# Patient Record
Sex: Male | Born: 1985 | Race: White | Hispanic: No | Marital: Single
Health system: Southern US, Community
[De-identification: ages and names within clinical notes are randomized; demographics above are authoritative.]

---
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
## 2004-09-26 ENCOUNTER — Encounter: Admission: RE | Admit: 2004-09-26 | Discharge: 2004-09-26 | Payer: Self-pay | Admitting: Orthopedic Surgery

## 2004-09-27 ENCOUNTER — Encounter: Admission: RE | Admit: 2004-09-27 | Discharge: 2004-09-27 | Payer: Self-pay | Admitting: Orthopedic Surgery

## 2017-02-09 ENCOUNTER — Emergency Department (HOSPITAL_COMMUNITY): Payer: Medicaid Other

## 2017-02-09 ENCOUNTER — Inpatient Hospital Stay (HOSPITAL_COMMUNITY)
Admission: EM | Admit: 2017-02-09 | Discharge: 2017-03-15 | DRG: 917 | Disposition: E | Payer: Medicaid Other | Attending: Emergency Medicine | Admitting: Emergency Medicine

## 2017-02-09 DIAGNOSIS — E871 Hypo-osmolality and hyponatremia: Secondary | ICD-10-CM | POA: Diagnosis present

## 2017-02-09 DIAGNOSIS — Q899 Congenital malformation, unspecified: Secondary | ICD-10-CM

## 2017-02-09 DIAGNOSIS — T401X1A Poisoning by heroin, accidental (unintentional), initial encounter: Principal | ICD-10-CM | POA: Diagnosis present

## 2017-02-09 DIAGNOSIS — R402312 Coma scale, best motor response, none, at arrival to emergency department: Secondary | ICD-10-CM | POA: Diagnosis present

## 2017-02-09 DIAGNOSIS — Z515 Encounter for palliative care: Secondary | ICD-10-CM | POA: Diagnosis present

## 2017-02-09 DIAGNOSIS — Z9911 Dependence on respirator [ventilator] status: Secondary | ICD-10-CM

## 2017-02-09 DIAGNOSIS — J9600 Acute respiratory failure, unspecified whether with hypoxia or hypercapnia: Secondary | ICD-10-CM

## 2017-02-09 DIAGNOSIS — R402212 Coma scale, best verbal response, none, at arrival to emergency department: Secondary | ICD-10-CM | POA: Diagnosis present

## 2017-02-09 DIAGNOSIS — J690 Pneumonitis due to inhalation of food and vomit: Secondary | ICD-10-CM | POA: Diagnosis present

## 2017-02-09 DIAGNOSIS — D638 Anemia in other chronic diseases classified elsewhere: Secondary | ICD-10-CM | POA: Diagnosis present

## 2017-02-09 DIAGNOSIS — J80 Acute respiratory distress syndrome: Secondary | ICD-10-CM | POA: Diagnosis present

## 2017-02-09 DIAGNOSIS — R739 Hyperglycemia, unspecified: Secondary | ICD-10-CM | POA: Diagnosis not present

## 2017-02-09 DIAGNOSIS — G931 Anoxic brain damage, not elsewhere classified: Secondary | ICD-10-CM | POA: Diagnosis present

## 2017-02-09 DIAGNOSIS — E874 Mixed disorder of acid-base balance: Secondary | ICD-10-CM | POA: Diagnosis present

## 2017-02-09 DIAGNOSIS — R34 Anuria and oliguria: Secondary | ICD-10-CM | POA: Diagnosis present

## 2017-02-09 DIAGNOSIS — I469 Cardiac arrest, cause unspecified: Secondary | ICD-10-CM | POA: Diagnosis present

## 2017-02-09 DIAGNOSIS — Z4659 Encounter for fitting and adjustment of other gastrointestinal appliance and device: Secondary | ICD-10-CM

## 2017-02-09 DIAGNOSIS — I214 Non-ST elevation (NSTEMI) myocardial infarction: Secondary | ICD-10-CM | POA: Diagnosis present

## 2017-02-09 DIAGNOSIS — Z9289 Personal history of other medical treatment: Secondary | ICD-10-CM

## 2017-02-09 DIAGNOSIS — D696 Thrombocytopenia, unspecified: Secondary | ICD-10-CM | POA: Diagnosis present

## 2017-02-09 DIAGNOSIS — G253 Myoclonus: Secondary | ICD-10-CM | POA: Diagnosis present

## 2017-02-09 DIAGNOSIS — E162 Hypoglycemia, unspecified: Secondary | ICD-10-CM | POA: Diagnosis present

## 2017-02-09 DIAGNOSIS — E46 Unspecified protein-calorie malnutrition: Secondary | ICD-10-CM | POA: Diagnosis present

## 2017-02-09 DIAGNOSIS — E875 Hyperkalemia: Secondary | ICD-10-CM | POA: Diagnosis present

## 2017-02-09 DIAGNOSIS — Z6825 Body mass index (BMI) 25.0-25.9, adult: Secondary | ICD-10-CM

## 2017-02-09 DIAGNOSIS — N170 Acute kidney failure with tubular necrosis: Secondary | ICD-10-CM | POA: Diagnosis present

## 2017-02-09 DIAGNOSIS — F1120 Opioid dependence, uncomplicated: Secondary | ICD-10-CM | POA: Diagnosis present

## 2017-02-09 DIAGNOSIS — Z66 Do not resuscitate: Secondary | ICD-10-CM | POA: Diagnosis not present

## 2017-02-09 DIAGNOSIS — G9341 Metabolic encephalopathy: Secondary | ICD-10-CM | POA: Diagnosis present

## 2017-02-09 DIAGNOSIS — Z0189 Encounter for other specified special examinations: Secondary | ICD-10-CM

## 2017-02-09 DIAGNOSIS — R402112 Coma scale, eyes open, never, at arrival to emergency department: Secondary | ICD-10-CM | POA: Diagnosis present

## 2017-02-09 LAB — I-STAT TROPONIN, ED: Troponin i, poc: 0.12 ng/mL (ref 0.00–0.08)

## 2017-02-09 LAB — I-STAT ARTERIAL BLOOD GAS, ED
Acid-base deficit: 17 mmol/L — ABNORMAL HIGH (ref 0.0–2.0)
Bicarbonate: 13.8 mmol/L — ABNORMAL LOW (ref 20.0–28.0)
O2 Saturation: 99 %
PCO2 ART: 55.9 mmHg — AB (ref 32.0–48.0)
PH ART: 7 — AB (ref 7.350–7.450)
TCO2: 15 mmol/L (ref 0–100)
pO2, Arterial: 204 mmHg — ABNORMAL HIGH (ref 83.0–108.0)

## 2017-02-09 LAB — URINALYSIS, ROUTINE W REFLEX MICROSCOPIC
Bilirubin Urine: NEGATIVE
GLUCOSE, UA: 150 mg/dL — AB
HGB URINE DIPSTICK: NEGATIVE
Ketones, ur: NEGATIVE mg/dL
LEUKOCYTES UA: NEGATIVE
NITRITE: NEGATIVE
PH: 5 (ref 5.0–8.0)
Protein, ur: 100 mg/dL — AB
SPECIFIC GRAVITY, URINE: 1.017 (ref 1.005–1.030)

## 2017-02-09 LAB — SALICYLATE LEVEL: Salicylate Lvl: 7.1 mg/dL (ref 2.8–30.0)

## 2017-02-09 LAB — ACETAMINOPHEN LEVEL: Acetaminophen (Tylenol), Serum: <10 ug/mL — ABNORMAL LOW (ref 10–30)

## 2017-02-09 LAB — I-STAT CHEM 8, ED
BUN: 23 mg/dL — ABNORMAL HIGH (ref 6–20)
CHLORIDE: 103 mmol/L (ref 101–111)
Calcium, Ion: 0.99 mmol/L — ABNORMAL LOW (ref 1.15–1.40)
Creatinine, Ser: 1.6 mg/dL — ABNORMAL HIGH (ref 0.61–1.24)
GLUCOSE: 49 mg/dL — AB (ref 65–99)
HEMATOCRIT: 35 % — AB (ref 39.0–52.0)
Hemoglobin: 11.9 g/dL — ABNORMAL LOW (ref 13.0–17.0)
POTASSIUM: 5.4 mmol/L — AB (ref 3.5–5.1)
Sodium: 134 mmol/L — ABNORMAL LOW (ref 135–145)
TCO2: 14 mmol/L (ref 0–100)

## 2017-02-09 LAB — CBC
HEMATOCRIT: 37.5 % — AB (ref 39.0–52.0)
HEMOGLOBIN: 12.2 g/dL — AB (ref 13.0–17.0)
MCH: 29.5 pg (ref 26.0–34.0)
MCHC: 32.5 g/dL (ref 30.0–36.0)
MCV: 90.8 fL (ref 78.0–100.0)
Platelets: 187 10*3/uL (ref 150–400)
RBC: 4.13 MIL/uL — ABNORMAL LOW (ref 4.22–5.81)
RDW: 12.7 % (ref 11.5–15.5)
WBC: 9.6 10*3/uL (ref 4.0–10.5)

## 2017-02-09 LAB — I-STAT CG4 LACTIC ACID, ED: LACTIC ACID, VENOUS: 14.73 mmol/L — AB (ref 0.5–1.9)

## 2017-02-09 LAB — BASIC METABOLIC PANEL
Anion gap: 24 — ABNORMAL HIGH (ref 5–15)
BUN: 15 mg/dL (ref 6–20)
CALCIUM: 8.3 mg/dL — AB (ref 8.9–10.3)
CO2: 12 mmol/L — ABNORMAL LOW (ref 22–32)
CREATININE: 2 mg/dL — AB (ref 0.61–1.24)
Chloride: 100 mmol/L — ABNORMAL LOW (ref 101–111)
GFR calc non Af Amer: 42 mL/min — ABNORMAL LOW (ref >60)
GFR, EST AFRICAN AMERICAN: 49 mL/min — AB (ref >60)
Glucose, Bld: 52 mg/dL — ABNORMAL LOW (ref 65–99)
Potassium: 5.4 mmol/L — ABNORMAL HIGH (ref 3.5–5.1)
SODIUM: 136 mmol/L (ref 135–145)

## 2017-02-09 LAB — PROTIME-INR
INR: 1.61
Prothrombin Time: 19.3 seconds — ABNORMAL HIGH (ref 11.4–15.2)

## 2017-02-09 LAB — TROPONIN I: TROPONIN I: 0.08 ng/mL — AB (ref <0.03)

## 2017-02-09 LAB — APTT: aPTT: 51 seconds — ABNORMAL HIGH (ref 24–36)

## 2017-02-09 LAB — ETHANOL: ALCOHOL ETHYL (B): 6 mg/dL — AB (ref <5)

## 2017-02-09 MED ORDER — NOREPINEPHRINE BITARTRATE 1 MG/ML IV SOLN
5.0000 ug/min | INTRAVENOUS | Status: DC
  Administered 2017-02-09: 5 ug/min via INTRAVENOUS
  Filled 2017-02-09: qty 4

## 2017-02-09 MED ORDER — SODIUM CHLORIDE 0.9 % IV BOLUS (SEPSIS)
1000.0000 mL | Freq: Once | INTRAVENOUS | Status: AC
  Administered 2017-02-09: 1000 mL via INTRAVENOUS

## 2017-02-09 NOTE — ED Notes (Signed)
Pt taken to CT.

## 2017-02-09 NOTE — ED Notes (Signed)
OG attempted x3 without success

## 2017-02-09 NOTE — ED Provider Notes (Signed)
[facility]-EMERGENCY DEPT Provider Note   CSN: [id number] Arrival date & time: 01/20/2017  2246     History   Chief Complaint Chief Complaint  Patient presents with  . Cardiac Arrest    HPI Hector Dennis is a 31 y.o. male.  Level 5 caveat for acuity of condition. Patient presents via EMS after CPR. Was found unresponsive in his hotel room with vomit on his face. Suspected heroin overdose. Initial rhythm was asystole. Narcan given and CPR performed by fire department for 15 minutes approximately. Patient received 4 doses of epinephrine. Rhythm changed to PEA and sinus tachycardia. Patient remains unresponsive with no purposeful movement. No other medical history known. No evidence of trauma.   The history is provided by the EMS personnel. The history is limited by the condition of the patient.  Cardiac Arrest    No past medical history on file.  There are no active problems to display for this patient.   No past surgical history on file.     Home Medications    Prior to Admission medications   Not on File    Family History No family history on file.  Social History Social History  Substance Use Topics  . Smoking status: Not on file  . Smokeless tobacco: Not on file  . Alcohol use Not on file     Allergies   Patient has no allergy information on record.   Review of Systems Review of Systems  Unable to perform ROS: Acuity of condition     Physical Exam Updated Vital Signs BP 122/65   Pulse 100   Resp (!) 22   Ht 6\' 2"  (1.88 m)   Wt 90.7 kg (200 lb)   SpO2 98%   BMI 25.68 kg/m   Physical Exam  Constitutional: He appears well-developed and well-nourished. He appears distressed.  GCS 3 unresponsive  HENT:  Head: Normocephalic and atraumatic.  Right Ear: External ear normal.  Left Ear: External ear normal.  Eyes: Conjunctivae are normal.  Pupils dilated and nonreactive bilaterally  Neck: Normal range of motion. Neck supple.  Cardiovascular: Normal  rate, regular rhythm and normal heart sounds.   No murmur heard. Pulmonary/Chest:  Equal breath sounds with bagging  Abdominal: There is no tenderness. There is no rebound and no guarding.  Musculoskeletal: Normal range of motion.  Bilateral IOs in place R ankle appearing rotated.  Neurological:  GCS 3 unresponsive  Skin: Skin is warm. Capillary refill takes less than 2 seconds.     ED Treatments / Results  Labs (all labs ordered are listed, but only abnormal results are displayed) Labs Reviewed  BASIC METABOLIC PANEL - Abnormal; Notable for the following:       Result Value   Potassium 5.4 (*)    Chloride 100 (*)    CO2 12 (*)    Glucose, Bld 52 (*)    Creatinine, Ser 2.00 (*)    Calcium 8.3 (*)    GFR calc non Af Amer 42 (*)    GFR calc Af Amer 49 (*)    Anion gap 24 (*)    All other components within normal limits  CBC - Abnormal; Notable for the following:    RBC 4.13 (*)    Hemoglobin 12.2 (*)    HCT 37.5 (*)    All other components within normal limits  APTT - Abnormal; Notable for the following:    aPTT 51 (*)    All other components within normal limits  PROTIME-INR -  Abnormal; Notable for the following:    Prothrombin Time 19.3 (*)    All other components within normal limits  TROPONIN I - Abnormal; Notable for the following:    Troponin I 0.08 (*)    All other components within normal limits  RAPID URINE DRUG SCREEN, HOSP PERFORMED - Abnormal; Notable for the following:    Opiates POSITIVE (*)    Cocaine POSITIVE (*)    Tetrahydrocannabinol POSITIVE (*)    All other components within normal limits  URINALYSIS, ROUTINE W REFLEX MICROSCOPIC - Abnormal; Notable for the following:    APPearance CLOUDY (*)    Glucose, UA 150 (*)    Protein, ur 100 (*)    Bacteria, UA MANY (*)    Squamous Epithelial / LPF 0-5 (*)    All other components within normal limits  ACETAMINOPHEN LEVEL - Abnormal; Notable for the following:    Acetaminophen (Tylenol), Serum <10  (*)    All other components within normal limits  ETHANOL - Abnormal; Notable for the following:    Alcohol, Ethyl (B) 6 (*)    All other components within normal limits  CBC - Abnormal; Notable for the following:    RBC 3.68 (*)    Hemoglobin 10.8 (*)    HCT 31.8 (*)    Platelets 117 (*)    All other components within normal limits  CBC - Abnormal; Notable for the following:    WBC 3.7 (*)    RBC 3.72 (*)    Hemoglobin 11.1 (*)    HCT 32.0 (*)    Platelets 109 (*)    All other components within normal limits  PHOSPHORUS - Abnormal; Notable for the following:    Phosphorus 9.2 (*)    All other components within normal limits  TROPONIN I - Abnormal; Notable for the following:    Troponin I 0.63 (*)    All other components within normal limits  TROPONIN I - Abnormal; Notable for the following:    Troponin I 0.84 (*)    All other components within normal limits  BASIC METABOLIC PANEL - Abnormal; Notable for the following:    Sodium 134 (*)    Potassium 6.2 (*)    CO2 13 (*)    Glucose, Bld 128 (*)    Creatinine, Ser 1.82 (*)    Calcium 6.5 (*)    GFR calc non Af Amer 47 (*)    GFR calc Af Amer 55 (*)    All other components within normal limits  BASIC METABOLIC PANEL - Abnormal; Notable for the following:    Sodium 132 (*)    Potassium 5.6 (*)    CO2 15 (*)    Glucose, Bld 135 (*)    Creatinine, Ser 1.87 (*)    Calcium 7.3 (*)    GFR calc non Af Amer 46 (*)    GFR calc Af Amer 53 (*)    All other components within normal limits  PROTIME-INR - Abnormal; Notable for the following:    Prothrombin Time 23.5 (*)    All other components within normal limits  PROTIME-INR - Abnormal; Notable for the following:    Prothrombin Time 24.2 (*)    All other components within normal limits  APTT - Abnormal; Notable for the following:    aPTT 56 (*)    All other components within normal limits  APTT - Abnormal; Notable for the following:    aPTT 52 (*)    All other components  within normal limits  GLUCOSE, CAPILLARY - Abnormal; Notable for the following:    Glucose-Capillary 147 (*)    All other components within normal limits  GLUCOSE, CAPILLARY - Abnormal; Notable for the following:    Glucose-Capillary 156 (*)    All other components within normal limits  GLUCOSE, CAPILLARY - Abnormal; Notable for the following:    Glucose-Capillary 174 (*)    All other components within normal limits  GLUCOSE, CAPILLARY - Abnormal; Notable for the following:    Glucose-Capillary 197 (*)    All other components within normal limits  I-STAT CG4 LACTIC ACID, ED - Abnormal; Notable for the following:    Lactic Acid, Venous 14.73 (*)    All other components within normal limits  I-STAT TROPONIN, ED - Abnormal; Notable for the following:    Troponin i, poc 0.12 (*)    All other components within normal limits  CBG MONITORING, ED - Abnormal; Notable for the following:    Glucose-Capillary 128 (*)    All other components within normal limits  I-STAT CHEM 8, ED - Abnormal; Notable for the following:    Sodium 134 (*)    Potassium 5.4 (*)    BUN 23 (*)    Creatinine, Ser 1.60 (*)    Glucose, Bld 49 (*)    Calcium, Ion 0.99 (*)    Hemoglobin 11.9 (*)    HCT 35.0 (*)    All other components within normal limits  I-STAT ARTERIAL BLOOD GAS, ED - Abnormal; Notable for the following:    pH, Arterial 7.000 (*)    pCO2 arterial 55.9 (*)    pO2, Arterial 204.0 (*)    Bicarbonate 13.8 (*)    Acid-base deficit 17.0 (*)    All other components within normal limits  POCT I-STAT, CHEM 8 - Abnormal; Notable for the following:    Potassium 5.7 (*)    BUN 22 (*)    Creatinine, Ser 1.70 (*)    Glucose, Bld 128 (*)    Calcium, Ion 1.10 (*)    Hemoglobin 10.5 (*)    HCT 31.0 (*)    All other components within normal limits  POCT I-STAT 3, ART BLOOD GAS (G3+) - Abnormal; Notable for the following:    pH, Arterial 7.224 (*)    pO2, Arterial 56.0 (*)    Bicarbonate 15.2 (*)     Acid-base deficit 13.0 (*)    All other components within normal limits  POCT I-STAT 3, ART BLOOD GAS (G3+) - Abnormal; Notable for the following:    pH, Arterial 7.309 (*)    pCO2 arterial 27.9 (*)    pO2, Arterial 67.0 (*)    Bicarbonate 14.6 (*)    Acid-base deficit 11.0 (*)    All other components within normal limits  POCT I-STAT, CHEM 8 - Abnormal; Notable for the following:    BUN 23 (*)    Creatinine, Ser 1.50 (*)    Glucose, Bld 164 (*)    Calcium, Ion 1.06 (*)    Hemoglobin 10.2 (*)    HCT 30.0 (*)    All other components within normal limits  MRSA PCR SCREENING  BRAIN NATRIURETIC PEPTIDE  SALICYLATE LEVEL  MAGNESIUM  BLOOD GAS, ARTERIAL  HIV ANTIBODY (ROUTINE TESTING)  BLOOD GAS, ARTERIAL  TROPONIN I  TROPONIN I  BASIC METABOLIC PANEL  BASIC METABOLIC PANEL  BASIC METABOLIC PANEL  LACTIC ACID, PLASMA  LACTIC ACID, PLASMA  I-STAT CG4 LACTIC ACID, ED  I-STAT TROPONIN, ED  EKG  EKG Interpretation  Date/Time:  Sunday February 09 2017 23:51:15 EDT Ventricular Rate:  99 PR Interval:    QRS Duration: 96 QT Interval:  374 QTC Calculation: 480 R Axis:   111 Text Interpretation:  Sinus tachycardia Probable lateral infarct, old Confirmed by Glynn Octaveancour, Latiesha Harada [phone]([id number]) on 02/10/2017 12:00:59 AM       Radiology Ct Head Wo Contrast  Result Date: 01/20/2017 CLINICAL DATA:  [name]; 31 year old with respiratory arrest. Possible overdose. EXAM: CT HEAD WITHOUT CONTRAST CT CERVICAL SPINE WITHOUT CONTRAST TECHNIQUE: Multidetector CT imaging of the head and cervical spine was performed following the standard protocol without intravenous contrast. Multiplanar CT image reconstructions of the cervical spine were also generated. COMPARISON:  None. FINDINGS: CT HEAD FINDINGS Brain: No evidence of acute infarction, hemorrhage, hydrocephalus, extra-axial collection or mass lesion/mass effect. Vascular: No hyperdense vessel or unexpected calcification. Skull: Normal. Negative for  fracture or focal lesion. Sinuses/Orbits: Mucosal thickening and left-greater-than-right maxillary sinuses, left maxillary sinus mucous retention cyst, and mucosal thickening of left anterior ethmoid sinuses. Normally aerated mastoid air cells. Orbits are unremarkable. Other: None. CT CERVICAL SPINE FINDINGS Alignment: Straightening of cervical lordosis with mild reversal from the C5 through C7 levels. No listhesis. Skull base and vertebrae: No acute fracture. No primary bone lesion or focal pathologic process. Soft tissues and spinal canal: No prevertebral fluid or swelling. No visible canal hematoma. Disc levels: Mild discogenic degenerative changes at the C5 through C7 levels with disc space narrowing and anterior marginal osteophytes. No high-grade bony canal stenosis. Upper chest: Partially visualize left lung apex consolidation. Other: Negative. IMPRESSION: CT head: 1. No acute intracranial abnormality identified. 2. Mild paranasal sinus disease greatest in left maxillary sinus. CT cervical spine: 1. No acute fracture or dislocation of the cervical spine. 2. Mild cervical degenerative changes at the C5-C7 levels. 3. Partially visualize consolidation in left lung apex may represent pulmonary edema or pneumonia. Electronically Signed   By: Mitzi HansenLance  Furusawa-Stratton M.D.   On: 01/25/2017 23:59   Ct Cervical Spine Wo Contrast  Result Date: 01/21/2017 CLINICAL DATA:  [name]; 31 year old with respiratory arrest. Possible overdose. EXAM: CT HEAD WITHOUT CONTRAST CT CERVICAL SPINE WITHOUT CONTRAST TECHNIQUE: Multidetector CT imaging of the head and cervical spine was performed following the standard protocol without intravenous contrast. Multiplanar CT image reconstructions of the cervical spine were also generated. COMPARISON:  None. FINDINGS: CT HEAD FINDINGS Brain: No evidence of acute infarction, hemorrhage, hydrocephalus, extra-axial collection or mass lesion/mass effect. Vascular: No hyperdense vessel or  unexpected calcification. Skull: Normal. Negative for fracture or focal lesion. Sinuses/Orbits: Mucosal thickening and left-greater-than-right maxillary sinuses, left maxillary sinus mucous retention cyst, and mucosal thickening of left anterior ethmoid sinuses. Normally aerated mastoid air cells. Orbits are unremarkable. Other: None. CT CERVICAL SPINE FINDINGS Alignment: Straightening of cervical lordosis with mild reversal from the C5 through C7 levels. No listhesis. Skull base and vertebrae: No acute fracture. No primary bone lesion or focal pathologic process. Soft tissues and spinal canal: No prevertebral fluid or swelling. No visible canal hematoma. Disc levels: Mild discogenic degenerative changes at the C5 through C7 levels with disc space narrowing and anterior marginal osteophytes. No high-grade bony canal stenosis. Upper chest: Partially visualize left lung apex consolidation. Other: Negative. IMPRESSION: CT head: 1. No acute intracranial abnormality identified. 2. Mild paranasal sinus disease greatest in left maxillary sinus. CT cervical spine: 1. No acute fracture or dislocation of the cervical spine. 2. Mild cervical degenerative changes at the C5-C7 levels. 3.  Partially visualize consolidation in left lung apex may represent pulmonary edema or pneumonia. Electronically Signed   By: Mitzi Hansen M.D.   On: March 02, 2017 23:59   Dg Chest Portable 1 View  Result Date: 02/10/2017 CLINICAL DATA:  31 y/o  M; central line placement. EXAM: PORTABLE CHEST 1 VIEW COMPARISON:  2017/03/02 chest radiograph FINDINGS: Endotracheal tube is 3.8 cm from the carina. Right central venous catheter tip projects over the upper SVC. All stable bilateral mid and upper lung zone opacities. No appreciable pneumothorax or pleural effusion. Dextrocurvature of the thoracic spine. Stable cardiac silhouette. IMPRESSION: Right central venous catheter tip projects over the upper SVC. Stable endotracheal tube. Stable mid  and upper lung zone opacities. Electronically Signed   By: Mitzi Hansen M.D.   On: 02/10/2017 01:03   Dg Chest Port 1 View  Result Date: 2017/03/02 CLINICAL DATA:  31 year old male with trauma. EXAM: PORTABLE CHEST 1 VIEW COMPARISON:  None FINDINGS: An endotracheal tube is seen with tip approximately 4 cm above the carina. There bilateral patchy airspace densities primarily in the mid and upper lung fields which may represent pulmonary contusion. There is no pleural effusion or pneumothorax. No cardiomegaly. There is scoliosis of the thoracic spine. No acute osseous pathology. IMPRESSION: 1. Endotracheal tube approximately 4 cm above the carina. 2. Bilateral upper to mid lung field airspace densities, likely pulmonary contusions. Pneumonia is not excluded. Clinical correlation is recommended. Electronically Signed   By: Elgie Collard M.D.   On: Mar 02, 2017 23:48   Dg Abd Portable 1v  Result Date: 02/10/2017 CLINICAL DATA:  Enteric tube placement. EXAM: PORTABLE ABDOMEN - 1 VIEW COMPARISON:  None. FINDINGS: Enteric tube tip projects over gastric body. Transcutaneous pacing pads noted over the left lower chest wall. Unremarkable bowel gas pattern. No acute osseous abnormality identified. IMPRESSION: Enteric tube tip projects over gastric body. Electronically Signed   By: Mitzi Hansen M.D.   On: 02/10/2017 03:05   Lactate is 14 and is accurate. Procedures Procedures (including critical care time)  Medications Ordered in ED Medications  sodium chloride 0.9 % bolus 1,000 mL (not administered)  norepinephrine (LEVOPHED) 4 mg in dextrose 5 % 250 mL (0.016 mg/mL) infusion (not administered)     Initial Impression / Assessment and Plan / ED Course  I have reviewed the triage vital signs and the nursing notes.  Pertinent labs & imaging results that were available during my care of the patient were reviewed by me and considered in my medical decision making (see chart for  details).     Patient presents after suspected heroin overdose. he is unresponsive. after 15 minutes of CPR, ROSC achieved. Sinus rhythm on arrival. GCS is 3. Airway confirmed on arrival.  EKG was sinus tachycardia. No acute ST changes. Patient remains unresponsive with no gag and fixed and dilated pupils. No breathing over vent.  Cooling Protocol initiated. Patient given IV fluids, cool packs applied. Patient taken to CT scan labs and chest x-ray obtained.  Labs show mixed metabolic and respiratory acidosis.  No family is available. Patient remains unresponsive without any sedation. CT head and C-spine are negative. Chest x-ray shows no pneumonia.   Suspect opiate overdose. Drug screen is positive for opiates as well as cocaine. D/w critical care Dr. Marchelle Gearing who will evaluate.   Cooling protocol continued. Patient admitted to ICU in critical condition. No family available.  CRITICAL CARE Performed by: Glynn Octave Total critical care time: 45 minutes Critical care time was exclusive of separately billable procedures  and treating other patients. Critical care was necessary to treat or prevent imminent or life-threatening deterioration. Critical care was time spent personally by me on the following activities: development of treatment plan with patient and/or surrogate as well as nursing, discussions with consultants, evaluation of patient's response to treatment, examination of patient, obtaining history from patient or surrogate, ordering and performing treatments and interventions, ordering and review of laboratory studies, ordering and review of radiographic studies, pulse oximetry and re-evaluation of patient's condition.  Angiocath insertion Performed by: Glynn Octave  Consent: Verbal consent obtained. Risks and benefits: risks, benefits and alternatives were discussed Time out: Immediately prior to procedure a "time out" was called to verify the correct patient,  procedure, equipment, support staff and site/side marked as required.  Preparation: Patient was prepped and draped in the usual sterile fashion.  Vein Location: L IJ   Ultrasound Guided  Gauge: 18  Normal blood return and flush without difficulty Patient tolerance: Patient tolerated the procedure well with no immediate complications.      Final Clinical Impressions(s) / ED Diagnoses   Final diagnoses:  History of ETT  Cardiac arrest [facility])  Accidental overdose of heroin, initial encounter    New Prescriptions New Prescriptions   No medications on file     Glynn Octave, MD 02/10/17 [phone]

## 2017-02-09 NOTE — Code Documentation (Signed)
[facility]. Pt found by a roommate in a hotel room; suspected heroin use per roommate. CPR initiated at 2214, unknown downtime, ROSC at 2227; pt given 4mg  narcan, epi x4. Initial rhythm asystole. Strong femoral pulses on arrival. Pupils fixed and dilated, ETT  And bilateral IOs placed enroute.

## 2017-02-09 NOTE — Code Documentation (Signed)
CCM at bedside 

## 2017-02-09 NOTE — Code Documentation (Signed)
Ice packs applied to axilla and groin.

## 2017-02-10 ENCOUNTER — Inpatient Hospital Stay (HOSPITAL_COMMUNITY): Payer: Medicaid Other

## 2017-02-10 ENCOUNTER — Emergency Department (HOSPITAL_COMMUNITY): Payer: Medicaid Other

## 2017-02-10 DIAGNOSIS — G931 Anoxic brain damage, not elsewhere classified: Secondary | ICD-10-CM | POA: Diagnosis not present

## 2017-02-10 DIAGNOSIS — R34 Anuria and oliguria: Secondary | ICD-10-CM | POA: Diagnosis present

## 2017-02-10 DIAGNOSIS — N170 Acute kidney failure with tubular necrosis: Secondary | ICD-10-CM | POA: Diagnosis present

## 2017-02-10 DIAGNOSIS — I469 Cardiac arrest, cause unspecified: Secondary | ICD-10-CM | POA: Diagnosis present

## 2017-02-10 DIAGNOSIS — I214 Non-ST elevation (NSTEMI) myocardial infarction: Secondary | ICD-10-CM | POA: Diagnosis present

## 2017-02-10 DIAGNOSIS — D638 Anemia in other chronic diseases classified elsewhere: Secondary | ICD-10-CM | POA: Diagnosis present

## 2017-02-10 DIAGNOSIS — T401X1A Poisoning by heroin, accidental (unintentional), initial encounter: Secondary | ICD-10-CM | POA: Diagnosis present

## 2017-02-10 DIAGNOSIS — Z66 Do not resuscitate: Secondary | ICD-10-CM | POA: Diagnosis not present

## 2017-02-10 DIAGNOSIS — F1120 Opioid dependence, uncomplicated: Secondary | ICD-10-CM | POA: Diagnosis present

## 2017-02-10 DIAGNOSIS — E874 Mixed disorder of acid-base balance: Secondary | ICD-10-CM | POA: Diagnosis present

## 2017-02-10 DIAGNOSIS — G9341 Metabolic encephalopathy: Secondary | ICD-10-CM | POA: Diagnosis present

## 2017-02-10 DIAGNOSIS — E162 Hypoglycemia, unspecified: Secondary | ICD-10-CM | POA: Diagnosis present

## 2017-02-10 DIAGNOSIS — E46 Unspecified protein-calorie malnutrition: Secondary | ICD-10-CM | POA: Diagnosis present

## 2017-02-10 DIAGNOSIS — J80 Acute respiratory distress syndrome: Secondary | ICD-10-CM | POA: Diagnosis present

## 2017-02-10 DIAGNOSIS — E875 Hyperkalemia: Secondary | ICD-10-CM | POA: Diagnosis present

## 2017-02-10 DIAGNOSIS — E871 Hypo-osmolality and hyponatremia: Secondary | ICD-10-CM | POA: Diagnosis present

## 2017-02-10 DIAGNOSIS — R402312 Coma scale, best motor response, none, at arrival to emergency department: Secondary | ICD-10-CM | POA: Diagnosis present

## 2017-02-10 DIAGNOSIS — D696 Thrombocytopenia, unspecified: Secondary | ICD-10-CM | POA: Diagnosis present

## 2017-02-10 DIAGNOSIS — R402212 Coma scale, best verbal response, none, at arrival to emergency department: Secondary | ICD-10-CM | POA: Diagnosis present

## 2017-02-10 DIAGNOSIS — J690 Pneumonitis due to inhalation of food and vomit: Secondary | ICD-10-CM | POA: Diagnosis present

## 2017-02-10 DIAGNOSIS — Z9911 Dependence on respirator [ventilator] status: Secondary | ICD-10-CM | POA: Diagnosis not present

## 2017-02-10 DIAGNOSIS — R402112 Coma scale, eyes open, never, at arrival to emergency department: Secondary | ICD-10-CM | POA: Diagnosis present

## 2017-02-10 DIAGNOSIS — J9600 Acute respiratory failure, unspecified whether with hypoxia or hypercapnia: Secondary | ICD-10-CM | POA: Diagnosis not present

## 2017-02-10 DIAGNOSIS — Z515 Encounter for palliative care: Secondary | ICD-10-CM | POA: Diagnosis present

## 2017-02-10 LAB — POCT I-STAT, CHEM 8
BUN: 22 mg/dL — ABNORMAL HIGH (ref 6–20)
BUN: 23 mg/dL — AB (ref 6–20)
CALCIUM ION: 1.1 mmol/L — AB (ref 1.15–1.40)
CALCIUM ION: 1.06 mmol/L — AB (ref 1.15–1.40)
CHLORIDE: 103 mmol/L (ref 101–111)
CHLORIDE: 105 mmol/L (ref 101–111)
CREATININE: 1.5 mg/dL — AB (ref 0.61–1.24)
Creatinine, Ser: 1.7 mg/dL — ABNORMAL HIGH (ref 0.61–1.24)
GLUCOSE: 128 mg/dL — AB (ref 65–99)
GLUCOSE: 164 mg/dL — AB (ref 65–99)
HCT: 31 % — ABNORMAL LOW (ref 39.0–52.0)
HCT: 30 % — ABNORMAL LOW (ref 39.0–52.0)
Hemoglobin: 10.5 g/dL — ABNORMAL LOW (ref 13.0–17.0)
Hemoglobin: 10.2 g/dL — ABNORMAL LOW (ref 13.0–17.0)
POTASSIUM: 4.9 mmol/L (ref 3.5–5.1)
Potassium: 5.7 mmol/L — ABNORMAL HIGH (ref 3.5–5.1)
Sodium: 136 mmol/L (ref 135–145)
Sodium: 135 mmol/L (ref 135–145)
TCO2: 18 mmol/L (ref 0–100)
TCO2: 16 mmol/L (ref 0–100)

## 2017-02-10 LAB — TROPONIN I
TROPONIN I: 0.63 ng/mL — AB (ref <0.03)
TROPONIN I: 0.84 ng/mL — AB (ref <0.03)
Troponin I: 3.8 ng/mL (ref <0.03)
Troponin I: 4.1 ng/mL (ref <0.03)

## 2017-02-10 LAB — POCT I-STAT 3, ART BLOOD GAS (G3+)
Acid-base deficit: 13 mmol/L — ABNORMAL HIGH (ref 0.0–2.0)
Acid-base deficit: 11 mmol/L — ABNORMAL HIGH (ref 0.0–2.0)
BICARBONATE: 14.6 mmol/L — AB (ref 20.0–28.0)
Bicarbonate: 15.2 mmol/L — ABNORMAL LOW (ref 20.0–28.0)
O2 SAT: 91 %
O2 SAT: 95 %
PCO2 ART: 34.6 mmHg (ref 32.0–48.0)
PCO2 ART: 27.9 mmHg — AB (ref 32.0–48.0)
PO2 ART: 56 mmHg — AB (ref 83.0–108.0)
PO2 ART: 67 mmHg — AB (ref 83.0–108.0)
Patient temperature: 90
Patient temperature: 33.5
TCO2: 17 mmol/L (ref 0–100)
TCO2: 16 mmol/L (ref 0–100)
pH, Arterial: 7.224 — ABNORMAL LOW (ref 7.350–7.450)
pH, Arterial: 7.309 — ABNORMAL LOW (ref 7.350–7.450)

## 2017-02-10 LAB — BASIC METABOLIC PANEL
Anion gap: 15 (ref 5–15)
Anion gap: 11 (ref 5–15)
Anion gap: 9 (ref 5–15)
Anion gap: 9 (ref 5–15)
Anion gap: 10 (ref 5–15)
BUN: 15 mg/dL (ref 6–20)
BUN: 17 mg/dL (ref 6–20)
BUN: 27 mg/dL — AB (ref 6–20)
BUN: 29 mg/dL — AB (ref 6–20)
BUN: 31 mg/dL — ABNORMAL HIGH (ref 6–20)
CALCIUM: 6.5 mg/dL — AB (ref 8.9–10.3)
CALCIUM: 7.3 mg/dL — AB (ref 8.9–10.3)
CALCIUM: 7.5 mg/dL — AB (ref 8.9–10.3)
CHLORIDE: 106 mmol/L (ref 101–111)
CHLORIDE: 108 mmol/L (ref 101–111)
CHLORIDE: 107 mmol/L (ref 101–111)
CO2: 13 mmol/L — AB (ref 22–32)
CO2: 15 mmol/L — AB (ref 22–32)
CO2: 15 mmol/L — ABNORMAL LOW (ref 22–32)
CO2: 15 mmol/L — AB (ref 22–32)
CO2: 13 mmol/L — AB (ref 22–32)
CREATININE: 1.82 mg/dL — AB (ref 0.61–1.24)
CREATININE: 1.87 mg/dL — AB (ref 0.61–1.24)
CREATININE: 1.93 mg/dL — AB (ref 0.61–1.24)
CREATININE: 1.95 mg/dL — AB (ref 0.61–1.24)
CREATININE: 2.11 mg/dL — AB (ref 0.61–1.24)
Calcium: 7.6 mg/dL — ABNORMAL LOW (ref 8.9–10.3)
Calcium: 7.6 mg/dL — ABNORMAL LOW (ref 8.9–10.3)
Chloride: 106 mmol/L (ref 101–111)
Chloride: 106 mmol/L (ref 101–111)
GFR calc Af Amer: 53 mL/min — ABNORMAL LOW (ref >60)
GFR calc Af Amer: 51 mL/min — ABNORMAL LOW (ref >60)
GFR calc Af Amer: 50 mL/min — ABNORMAL LOW (ref >60)
GFR calc Af Amer: 46 mL/min — ABNORMAL LOW (ref >60)
GFR calc non Af Amer: 47 mL/min — ABNORMAL LOW (ref >60)
GFR calc non Af Amer: 46 mL/min — ABNORMAL LOW (ref >60)
GFR calc non Af Amer: 44 mL/min — ABNORMAL LOW (ref >60)
GFR calc non Af Amer: 43 mL/min — ABNORMAL LOW (ref >60)
GFR calc non Af Amer: 39 mL/min — ABNORMAL LOW (ref >60)
GFR, EST AFRICAN AMERICAN: 55 mL/min — AB (ref >60)
GLUCOSE: 128 mg/dL — AB (ref 65–99)
GLUCOSE: 135 mg/dL — AB (ref 65–99)
Glucose, Bld: 222 mg/dL — ABNORMAL HIGH (ref 65–99)
Glucose, Bld: 159 mg/dL — ABNORMAL HIGH (ref 65–99)
Glucose, Bld: 114 mg/dL — ABNORMAL HIGH (ref 65–99)
Potassium: 6.2 mmol/L — ABNORMAL HIGH (ref 3.5–5.1)
Potassium: 5.6 mmol/L — ABNORMAL HIGH (ref 3.5–5.1)
Potassium: 4.4 mmol/L (ref 3.5–5.1)
Potassium: 4.4 mmol/L (ref 3.5–5.1)
Potassium: 4.6 mmol/L (ref 3.5–5.1)
SODIUM: 130 mmol/L — AB (ref 135–145)
SODIUM: 132 mmol/L — AB (ref 135–145)
SODIUM: 130 mmol/L — AB (ref 135–145)
Sodium: 134 mmol/L — ABNORMAL LOW (ref 135–145)
Sodium: 132 mmol/L — ABNORMAL LOW (ref 135–145)

## 2017-02-10 LAB — RAPID URINE DRUG SCREEN, HOSP PERFORMED
Amphetamines: NOT DETECTED
BARBITURATES: NOT DETECTED
Benzodiazepines: NOT DETECTED
Cocaine: POSITIVE — AB
Opiates: POSITIVE — AB
Tetrahydrocannabinol: POSITIVE — AB

## 2017-02-10 LAB — GLUCOSE, CAPILLARY
GLUCOSE-CAPILLARY: 147 mg/dL — AB (ref 65–99)
GLUCOSE-CAPILLARY: 156 mg/dL — AB (ref 65–99)
GLUCOSE-CAPILLARY: 197 mg/dL — AB (ref 65–99)
GLUCOSE-CAPILLARY: 229 mg/dL — AB (ref 65–99)
GLUCOSE-CAPILLARY: 218 mg/dL — AB (ref 65–99)
GLUCOSE-CAPILLARY: 216 mg/dL — AB (ref 65–99)
GLUCOSE-CAPILLARY: 193 mg/dL — AB (ref 65–99)
GLUCOSE-CAPILLARY: 126 mg/dL — AB (ref 65–99)
GLUCOSE-CAPILLARY: 105 mg/dL — AB (ref 65–99)
GLUCOSE-CAPILLARY: 105 mg/dL — AB (ref 65–99)
Glucose-Capillary: 102 mg/dL — ABNORMAL HIGH (ref 65–99)
Glucose-Capillary: 108 mg/dL — ABNORMAL HIGH (ref 65–99)
Glucose-Capillary: 120 mg/dL — ABNORMAL HIGH (ref 65–99)
Glucose-Capillary: 146 mg/dL — ABNORMAL HIGH (ref 65–99)
Glucose-Capillary: 158 mg/dL — ABNORMAL HIGH (ref 65–99)
Glucose-Capillary: 159 mg/dL — ABNORMAL HIGH (ref 65–99)
Glucose-Capillary: 173 mg/dL — ABNORMAL HIGH (ref 65–99)
Glucose-Capillary: 174 mg/dL — ABNORMAL HIGH (ref 65–99)
Glucose-Capillary: 174 mg/dL — ABNORMAL HIGH (ref 65–99)
Glucose-Capillary: 186 mg/dL — ABNORMAL HIGH (ref 65–99)
Glucose-Capillary: 215 mg/dL — ABNORMAL HIGH (ref 65–99)
Glucose-Capillary: 241 mg/dL — ABNORMAL HIGH (ref 65–99)
Glucose-Capillary: 99 mg/dL (ref 65–99)

## 2017-02-10 LAB — POCT I-STAT 4, (NA,K, GLUC, HGB,HCT)
GLUCOSE: 214 mg/dL — AB (ref 65–99)
HEMATOCRIT: 30 % — AB (ref 39.0–52.0)
HEMOGLOBIN: 10.2 g/dL — AB (ref 13.0–17.0)
Potassium: 4.4 mmol/L (ref 3.5–5.1)
Sodium: 135 mmol/L (ref 135–145)

## 2017-02-10 LAB — PROTIME-INR
INR: 2.06
INR: 2.14
INR: 2.15
Prothrombin Time: 23.5 seconds — ABNORMAL HIGH (ref 11.4–15.2)
Prothrombin Time: 24.2 seconds — ABNORMAL HIGH (ref 11.4–15.2)
Prothrombin Time: 24.4 seconds — ABNORMAL HIGH (ref 11.4–15.2)

## 2017-02-10 LAB — CBC
HCT: 31.8 % — ABNORMAL LOW (ref 39.0–52.0)
HCT: 32 % — ABNORMAL LOW (ref 39.0–52.0)
Hemoglobin: 10.8 g/dL — ABNORMAL LOW (ref 13.0–17.0)
Hemoglobin: 11.1 g/dL — ABNORMAL LOW (ref 13.0–17.0)
MCH: 29.3 pg (ref 26.0–34.0)
MCH: 29.8 pg (ref 26.0–34.0)
MCHC: 34 g/dL (ref 30.0–36.0)
MCHC: 34.7 g/dL (ref 30.0–36.0)
MCV: 86.4 fL (ref 78.0–100.0)
MCV: 86 fL (ref 78.0–100.0)
PLATELETS: 117 10*3/uL — AB (ref 150–400)
PLATELETS: 109 10*3/uL — AB (ref 150–400)
RBC: 3.68 MIL/uL — ABNORMAL LOW (ref 4.22–5.81)
RBC: 3.72 MIL/uL — AB (ref 4.22–5.81)
RDW: 12.5 % (ref 11.5–15.5)
RDW: 12.5 % (ref 11.5–15.5)
WBC: 4.1 10*3/uL (ref 4.0–10.5)
WBC: 3.7 10*3/uL — AB (ref 4.0–10.5)

## 2017-02-10 LAB — LACTIC ACID, PLASMA
LACTIC ACID, VENOUS: 2.6 mmol/L — AB (ref 0.5–1.9)
LACTIC ACID, VENOUS: 2.1 mmol/L — AB (ref 0.5–1.9)

## 2017-02-10 LAB — ECHOCARDIOGRAM COMPLETE
Height: 74 in
WEIGHTICAEL: 3199.32 [oz_av]

## 2017-02-10 LAB — BRAIN NATRIURETIC PEPTIDE: B Natriuretic Peptide: 88.2 pg/mL (ref 0.0–100.0)

## 2017-02-10 LAB — MAGNESIUM: MAGNESIUM: 2 mg/dL (ref 1.7–2.4)

## 2017-02-10 LAB — CBG MONITORING, ED: GLUCOSE-CAPILLARY: 128 mg/dL — AB (ref 65–99)

## 2017-02-10 LAB — PHOSPHORUS: PHOSPHORUS: 9.2 mg/dL — AB (ref 2.5–4.6)

## 2017-02-10 LAB — APTT
APTT: 56 s — AB (ref 24–36)
APTT: 52 s — AB (ref 24–36)

## 2017-02-10 LAB — HIV ANTIBODY (ROUTINE TESTING W REFLEX): HIV SCREEN 4TH GENERATION: NONREACTIVE

## 2017-02-10 LAB — MRSA PCR SCREENING: MRSA BY PCR: NEGATIVE

## 2017-02-10 MED ORDER — SODIUM CHLORIDE 0.9 % IV BOLUS (SEPSIS)
1000.0000 mL | Freq: Once | INTRAVENOUS | Status: AC
  Administered 2017-02-10: 1000 mL via INTRAVENOUS

## 2017-02-10 MED ORDER — LACTATED RINGERS IV SOLN: INTRAVENOUS | Status: DC

## 2017-02-10 MED ORDER — CISATRACURIUM BOLUS VIA INFUSION
0.0500 mg/kg | INTRAVENOUS | Status: DC | PRN
  Filled 2017-02-10: qty 5

## 2017-02-10 MED ORDER — SODIUM CHLORIDE 0.9 % IV SOLN: 250.0000 mL | INTRAVENOUS | Status: DC | PRN

## 2017-02-10 MED ORDER — DEXTROSE 50 % IV SOLN
INTRAVENOUS | Status: AC
  Filled 2017-02-10: qty 50

## 2017-02-10 MED ORDER — PROPOFOL 1000 MG/100ML IV EMUL
25.0000 ug/kg/min | INTRAVENOUS | Status: DC
Start: 2017-02-10 — End: 2017-02-11
  Administered 2017-02-10: 40 ug/kg/min via INTRAVENOUS
  Administered 2017-02-10 (×3): 60 ug/kg/min via INTRAVENOUS
  Administered 2017-02-10: 25 ug/kg/min via INTRAVENOUS
  Administered 2017-02-10: 60 ug/kg/min via INTRAVENOUS
  Administered 2017-02-10: 50 ug/kg/min via INTRAVENOUS
  Administered 2017-02-11 (×3): 60 ug/kg/min via INTRAVENOUS
  Filled 2017-02-10 (×14): qty 100

## 2017-02-10 MED ORDER — INSULIN GLARGINE 100 UNIT/ML ~~LOC~~ SOLN
10.0000 [IU] | SUBCUTANEOUS | Status: DC
  Administered 2017-02-10 – 2017-02-11 (×2): 10 [IU] via SUBCUTANEOUS
  Filled 2017-02-10 (×3): qty 0.1

## 2017-02-10 MED ORDER — HYDRALAZINE HCL 20 MG/ML IJ SOLN
10.0000 mg | Freq: Once | INTRAMUSCULAR | Status: AC
  Administered 2017-02-10: 20 mg via INTRAVENOUS

## 2017-02-10 MED ORDER — INSULIN ASPART 100 UNIT/ML ~~LOC~~ SOLN
2.0000 [IU] | SUBCUTANEOUS | Status: DC
  Administered 2017-02-11: 4 [IU] via SUBCUTANEOUS

## 2017-02-10 MED ORDER — ORAL CARE MOUTH RINSE
15.0000 mL | OROMUCOSAL | Status: DC
  Administered 2017-02-10 – 2017-02-12 (×24): 15 mL via OROMUCOSAL

## 2017-02-10 MED ORDER — CISATRACURIUM BESYLATE (PF) 200 MG/20ML IV SOLN
1.0000 ug/kg/min | INTRAVENOUS | Status: DC
  Administered 2017-02-10: 1 ug/kg/min via INTRAVENOUS
  Administered 2017-02-11: 1.2 ug/kg/min via INTRAVENOUS
  Filled 2017-02-10 (×2): qty 20

## 2017-02-10 MED ORDER — HYDRALAZINE HCL 20 MG/ML IJ SOLN
INTRAMUSCULAR | Status: AC
  Filled 2017-02-10: qty 1

## 2017-02-10 MED ORDER — FENTANYL 2500MCG IN NS 250ML (10MCG/ML) PREMIX INFUSION
100.0000 ug/h | INTRAVENOUS | Status: DC
  Administered 2017-02-10: 225 ug/h via INTRAVENOUS
  Administered 2017-02-10: 100 ug/h via INTRAVENOUS
  Administered 2017-02-10 – 2017-02-11 (×2): 250 ug/h via INTRAVENOUS
  Filled 2017-02-10 (×4): qty 250

## 2017-02-10 MED ORDER — ASPIRIN 300 MG RE SUPP
300.0000 mg | RECTAL | Status: AC
  Administered 2017-02-10: 300 mg via RECTAL
  Filled 2017-02-10: qty 1

## 2017-02-10 MED ORDER — FENTANYL BOLUS VIA INFUSION
50.0000 ug | INTRAVENOUS | Status: DC | PRN
  Administered 2017-02-10: 50 ug via INTRAVENOUS
  Filled 2017-02-10: qty 50

## 2017-02-10 MED ORDER — FAMOTIDINE IN NACL 20-0.9 MG/50ML-% IV SOLN
20.0000 mg | Freq: Two times a day (BID) | INTRAVENOUS | Status: DC
  Administered 2017-02-10 – 2017-02-11 (×4): 20 mg via INTRAVENOUS
  Filled 2017-02-10 (×4): qty 50

## 2017-02-10 MED ORDER — CHLORHEXIDINE GLUCONATE 0.12% ORAL RINSE (MEDLINE KIT)
15.0000 mL | Freq: Two times a day (BID) | OROMUCOSAL | Status: DC
  Administered 2017-02-10 – 2017-02-12 (×6): 15 mL via OROMUCOSAL

## 2017-02-10 MED ORDER — NOREPINEPHRINE BITARTRATE 1 MG/ML IV SOLN
0.0000 ug/min | INTRAVENOUS | Status: DC
  Filled 2017-02-10 (×2): qty 4

## 2017-02-10 MED ORDER — SODIUM CHLORIDE 0.9 % IV SOLN
INTRAVENOUS | Status: DC
  Administered 2017-02-10: 1.1 [IU]/h via INTRAVENOUS
  Filled 2017-02-10 (×2): qty 1

## 2017-02-10 MED ORDER — HYDRALAZINE HCL 20 MG/ML IJ SOLN
10.0000 mg | Freq: Four times a day (QID) | INTRAMUSCULAR | Status: DC | PRN
  Administered 2017-02-12 (×2): 20 mg via INTRAVENOUS
  Filled 2017-02-10 (×3): qty 1

## 2017-02-10 MED ORDER — CISATRACURIUM BOLUS VIA INFUSION
0.1000 mg/kg | Freq: Once | INTRAVENOUS | Status: AC
  Administered 2017-02-10: 9.1 mg via INTRAVENOUS
  Filled 2017-02-10: qty 10

## 2017-02-10 MED ORDER — HEPARIN SODIUM (PORCINE) 5000 UNIT/ML IJ SOLN: 5000.0000 [IU] | Freq: Three times a day (TID) | INTRAMUSCULAR | Status: DC

## 2017-02-10 MED ORDER — SODIUM CHLORIDE 0.9 % IV SOLN
1.0000 g | Freq: Once | INTRAVENOUS | Status: AC
  Administered 2017-02-10: 1 g via INTRAVENOUS
  Filled 2017-02-10: qty 10

## 2017-02-10 MED ORDER — HEPARIN SODIUM (PORCINE) 5000 UNIT/ML IJ SOLN
5000.0000 [IU] | Freq: Three times a day (TID) | INTRAMUSCULAR | Status: DC
  Administered 2017-02-10 – 2017-02-12 (×9): 5000 [IU] via SUBCUTANEOUS
  Filled 2017-02-10 (×9): qty 1

## 2017-02-10 MED ORDER — SODIUM CHLORIDE 0.9 % IV SOLN
INTRAVENOUS | Status: DC
  Administered 2017-02-10 – 2017-02-11 (×4): via INTRAVENOUS

## 2017-02-10 MED ORDER — DEXTROSE 50 % IV SOLN
INTRAVENOUS | Status: AC | PRN
  Administered 2017-02-10: 1 via INTRAVENOUS

## 2017-02-10 MED ORDER — ARTIFICIAL TEARS OPHTHALMIC OINT
1.0000 "application " | TOPICAL_OINTMENT | Freq: Three times a day (TID) | OPHTHALMIC | Status: DC
  Administered 2017-02-10 – 2017-02-12 (×8): 1 via OPHTHALMIC
  Filled 2017-02-10: qty 3.5

## 2017-02-10 MED ORDER — FENTANYL CITRATE (PF) 100 MCG/2ML IJ SOLN
100.0000 ug | Freq: Once | INTRAMUSCULAR | Status: AC
  Administered 2017-02-10: 100 ug via INTRAVENOUS

## 2017-02-10 NOTE — ED Notes (Signed)
Zoll pads and Artic sun pads in place

## 2017-02-10 NOTE — Progress Notes (Signed)
Pt was transported to [facility] room 23 from ER trama room A without any complications.

## 2017-02-10 NOTE — Progress Notes (Signed)
EEG Completed; Results Pending  

## 2017-02-10 NOTE — Progress Notes (Signed)
PULMONARY / CRITICAL CARE MEDICINE   Name: Hector Dennis MRN: [id number] DOB: [birth year]    ADMISSION DATE:  01/30/2017 CONSULTATION DATE:  02/05/2017  REFERRING MD:  Dr. Manus Gunning    CHIEF COMPLAINT:  Cardiac Arrest - Asystol initial rhythm   BRIEF SUMMARY:  [name] admitted 7/29 after being found down in a hotel room with suspected heroin overdose.  Initial rhythm asystole > CPR, epi, narcan x 15 minutes with rhythm change to PEA > ROSC after approximately 15 minutes ACLS. Therapeutic temperature management initiated on admit.     SUBJECTIVE:   VITAL SIGNS: BP (!) 151/87   Pulse 71   Temp (!) 90.9 F (32.7 C) (Core (Comment))   Resp (!) 22   Ht 6\' 2"  (1.88 m)   Wt 199 lb 15.3 oz (90.7 kg)   SpO2 100%   BMI 25.67 kg/m   HEMODYNAMICS: CVP:  [5 mmHg-6 mmHg] 5 mmHg  VENTILATOR SETTINGS: Vent Mode: PRVC FiO2 (%):  [50 %-100 %] 50 % Set Rate:  [16 bmp-22 bmp] 22 bmp Vt Set:  [600 mL-660 mL] 660 mL PEEP:  [5 cmH20] 5 cmH20 Plateau Pressure:  [22 cmH20] 22 cmH20  INTAKE / OUTPUT: I/O last 3 completed shifts: In: 4781.7 [I.V.:1621.7; IV Piggyback:3160] Out: 280 [Urine:280]  PHYSICAL EXAMINATION: General: critically ill appearing adult male on vent HEENT: MM pink/moist, ETT Neuro: sedate / paralyzed CV: s1s2 rrr, no m/r/g PULM: even/non-labored, lungs bilaterally  WU:JWJX, non-tender, bsx4 active  Extremities: warm/dry, no edema  Skin: no rashes or lesions   LABS:  BMET  Recent Labs Lab 02/10/17 0111 02/10/17 0200 02/10/17 0212 02/10/17 0359 02/10/17 0743 02/10/17 1134  NA 134* 132* 136 135 135 130*  K 6.2* 5.6* 5.7* 4.9 4.4 4.4  CL 106 106 103 105  --  106  CO2 13* 15*  --   --   --  15*  BUN 15 17 22* 23*  --  27*  CREATININE 1.82* 1.87* 1.70* 1.50*  --  1.93*  GLUCOSE 128* 135* 128* 164* 214* 222*    Electrolytes  Recent Labs Lab 02/10/17 0111 02/10/17 0200 02/10/17 0220 02/10/17 1134  CALCIUM 6.5* 7.3*  --  7.5*  MG  --   --  2.0  --   PHOS   --   --  9.2*  --     CBC  Recent Labs Lab 02/06/2017 2258  02/10/17 0111  02/10/17 0220 02/10/17 0359 02/10/17 0743  WBC 9.6  --  4.1  --  3.7*  --   --   HGB 12.2*  < > 10.8*  < > 11.1* 10.2* 10.2*  HCT 37.5*  < > 31.8*  < > 32.0* 30.0* 30.0*  PLT 187  --  117*  --  109*  --   --   < > = values in this interval not displayed.  Coag's  Recent Labs Lab 02/05/2017 2258 02/10/17 0111 02/10/17 0220 02/10/17 1134  APTT 51* 56* 52*  --   INR 1.61 2.06 2.14 2.15    Sepsis Markers  Recent Labs Lab 02/11/2017 2314 02/10/17 0635 02/10/17 0831  LATICACIDVEN 14.73* 2.6* 2.1*    ABG  Recent Labs Lab 01/28/2017 2346 02/10/17 0253 02/10/17 0601  PHART 7.000* 7.224* 7.309*  PCO2ART 55.9* 34.6 27.9*  PO2ART 204.0* 56.0* 67.0*    Liver Enzymes No results for input(s): AST, ALT, ALKPHOS, BILITOT, ALBUMIN in the last 168 hours.  Cardiac Enzymes  Recent Labs Lab 02/10/17 0111 02/10/17 0220  02/10/17 1156  TROPONINI 0.63* 0.84* 3.80*    Glucose  Recent Labs Lab 02/10/17 0913 02/10/17 1000 02/10/17 1105 02/10/17 1204 02/10/17 1309 02/10/17 1424  GLUCAP 241* 174* 218* 216* 193* 186*    Imaging Ct Head Wo Contrast  Result Date: 01/23/2017 CLINICAL DATA:  [name]; 31 year old with respiratory arrest. Possible overdose. EXAM: CT HEAD WITHOUT CONTRAST CT CERVICAL SPINE WITHOUT CONTRAST TECHNIQUE: Multidetector CT imaging of the head and cervical spine was performed following the standard protocol without intravenous contrast. Multiplanar CT image reconstructions of the cervical spine were also generated. COMPARISON:  None. FINDINGS: CT HEAD FINDINGS Brain: No evidence of acute infarction, hemorrhage, hydrocephalus, extra-axial collection or mass lesion/mass effect. Vascular: No hyperdense vessel or unexpected calcification. Skull: Normal. Negative for fracture or focal lesion. Sinuses/Orbits: Mucosal thickening and left-greater-than-right maxillary sinuses, left  maxillary sinus mucous retention cyst, and mucosal thickening of left anterior ethmoid sinuses. Normally aerated mastoid air cells. Orbits are unremarkable. Other: None. CT CERVICAL SPINE FINDINGS Alignment: Straightening of cervical lordosis with mild reversal from the C5 through C7 levels. No listhesis. Skull base and vertebrae: No acute fracture. No primary bone lesion or focal pathologic process. Soft tissues and spinal canal: No prevertebral fluid or swelling. No visible canal hematoma. Disc levels: Mild discogenic degenerative changes at the C5 through C7 levels with disc space narrowing and anterior marginal osteophytes. No high-grade bony canal stenosis. Upper chest: Partially visualize left lung apex consolidation. Other: Negative. IMPRESSION: CT head: 1. No acute intracranial abnormality identified. 2. Mild paranasal sinus disease greatest in left maxillary sinus. CT cervical spine: 1. No acute fracture or dislocation of the cervical spine. 2. Mild cervical degenerative changes at the C5-C7 levels. 3. Partially visualize consolidation in left lung apex may represent pulmonary edema or pneumonia. Electronically Signed   By: Mitzi HansenLance  Furusawa-Stratton M.D.   On: 01/25/2017 23:59   Ct Cervical Spine Wo Contrast  Result Date: 02/08/2017 CLINICAL DATA:  [name]; 31 year old with respiratory arrest. Possible overdose. EXAM: CT HEAD WITHOUT CONTRAST CT CERVICAL SPINE WITHOUT CONTRAST TECHNIQUE: Multidetector CT imaging of the head and cervical spine was performed following the standard protocol without intravenous contrast. Multiplanar CT image reconstructions of the cervical spine were also generated. COMPARISON:  None. FINDINGS: CT HEAD FINDINGS Brain: No evidence of acute infarction, hemorrhage, hydrocephalus, extra-axial collection or mass lesion/mass effect. Vascular: No hyperdense vessel or unexpected calcification. Skull: Normal. Negative for fracture or focal lesion. Sinuses/Orbits: Mucosal thickening  and left-greater-than-right maxillary sinuses, left maxillary sinus mucous retention cyst, and mucosal thickening of left anterior ethmoid sinuses. Normally aerated mastoid air cells. Orbits are unremarkable. Other: None. CT CERVICAL SPINE FINDINGS Alignment: Straightening of cervical lordosis with mild reversal from the C5 through C7 levels. No listhesis. Skull base and vertebrae: No acute fracture. No primary bone lesion or focal pathologic process. Soft tissues and spinal canal: No prevertebral fluid or swelling. No visible canal hematoma. Disc levels: Mild discogenic degenerative changes at the C5 through C7 levels with disc space narrowing and anterior marginal osteophytes. No high-grade bony canal stenosis. Upper chest: Partially visualize left lung apex consolidation. Other: Negative. IMPRESSION: CT head: 1. No acute intracranial abnormality identified. 2. Mild paranasal sinus disease greatest in left maxillary sinus. CT cervical spine: 1. No acute fracture or dislocation of the cervical spine. 2. Mild cervical degenerative changes at the C5-C7 levels. 3. Partially visualize consolidation in left lung apex may represent pulmonary edema or pneumonia. Electronically Signed   By: Buzzy HanLance  Furusawa-Stratton M.D.  On: 01/21/2017 23:59   Dg Chest Portable 1 View  Result Date: 02/10/2017 CLINICAL DATA:  31 y/o  M; central line placement. EXAM: PORTABLE CHEST 1 VIEW COMPARISON:  01/25/2017 chest radiograph FINDINGS: Endotracheal tube is 3.8 cm from the carina. Right central venous catheter tip projects over the upper SVC. All stable bilateral mid and upper lung zone opacities. No appreciable pneumothorax or pleural effusion. Dextrocurvature of the thoracic spine. Stable cardiac silhouette. IMPRESSION: Right central venous catheter tip projects over the upper SVC. Stable endotracheal tube. Stable mid and upper lung zone opacities. Electronically Signed   By: Mitzi HansenLance  Furusawa-Stratton M.D.   On: 02/10/2017 01:03    Dg Chest Port 1 View  Result Date: 01/14/2017 CLINICAL DATA:  31 year old male with trauma. EXAM: PORTABLE CHEST 1 VIEW COMPARISON:  None FINDINGS: An endotracheal tube is seen with tip approximately 4 cm above the carina. There bilateral patchy airspace densities primarily in the mid and upper lung fields which may represent pulmonary contusion. There is no pleural effusion or pneumothorax. No cardiomegaly. There is scoliosis of the thoracic spine. No acute osseous pathology. IMPRESSION: 1. Endotracheal tube approximately 4 cm above the carina. 2. Bilateral upper to mid lung field airspace densities, likely pulmonary contusions. Pneumonia is not excluded. Clinical correlation is recommended. Electronically Signed   By: Elgie CollardArash  Radparvar M.D.   On: 01/22/2017 23:48   Dg Ankle Right Port  Result Date: 02/10/2017 CLINICAL DATA:  Right ankle pain and swelling. EXAM: PORTABLE RIGHT ANKLE - 2 VIEW COMPARISON:  None. FINDINGS: There is no evidence of fracture, dislocation, or joint effusion. There is no evidence of arthropathy or other focal bone abnormality. Soft tissues are unremarkable. IMPRESSION: Normal right ankle. Electronically Signed   By: Lupita RaiderJames  Green Jr, M.D.   On: 02/10/2017 10:38   Dg Abd Portable 1v  Result Date: 02/10/2017 CLINICAL DATA:  Enteric tube placement. EXAM: PORTABLE ABDOMEN - 1 VIEW COMPARISON:  None. FINDINGS: Enteric tube tip projects over gastric body. Transcutaneous pacing pads noted over the left lower chest wall. Unremarkable bowel gas pattern. No acute osseous abnormality identified. IMPRESSION: Enteric tube tip projects over gastric body. Electronically Signed   By: Mitzi HansenLance  Furusawa-Stratton M.D.   On: 02/10/2017 03:05     STUDIES:  7/29  CT Head / Cervical Spine >> no acute intracranial abnormality, mild paranasal sinus disease, no acute fracture or dislocation of cervical spine, partially visualized consolidation in the left lung apex (?edema vs PNA) 7/30  ECHO >>  normal LV size, mild LVH, LVEF 60-65%, normal wall motion, mild MR, mild RA/RV dilatation, PA peak pressure 33mmHg   CULTURES: BCx2 7/30 >>   ANTIBIOTICS:   SIGNIFICANT EVENTS: 7/29  Admit after cardiac arrest with suspected heroin overdose, hypothermia protocol   LINES/TUBES: ETT 7/29 >>  TLC 7/30 >>  R Radial Aline 7/30 >>   DISCUSSION: [name] admitted 7/29 after being found down at a hotel with suspected heroin overdose in cardiac arrest. Approximately 15 minutes of CPR before ROSC.   ASSESSMENT / PLAN:  PULMONARY A: Acute Hypoxic Respiratory Failure - in setting of cardiac arrest, possible aspiration, +/- pulmonary edema.   Bilateral Infiltrates / Rule Out Aspiration PNA - Hx of vomiting prior to arrival but CXR appears more consistent with edema Mild Elevation of PA pressure - noted on ECHO 7/30 P:   PRVC 6cc/kg  Wean PEEP / FiO2 for sats > 92% Trend CXR, ABG  Await neuro exam / rewarming  VAP prevention measures  CARDIOVASCULAR A:  Cardiac Arrest - initial rhythm asystole, ~ 15 min CPR per EMS.   Elevated Troponin - suspect post arrest IVDA  P:  Therapeutic temperature management protocol  Paralytics / sedation, eye care per protocol  ICU monitoring of hemodyanamics  ECHO as above  Levophed as needed for MAP >65 PRN hydralazine for SBP >180 Trend EKG, CVP   RENAL A:   Acute Kidney Injury - in setting of cardiac arrest / hypoperfusion Hyponatremia  Mild Elevation of Lactic Acid  P:   Trend BMP / urinary output Serial BMP with hypothermia protocol  Replace electrolytes as indicated Avoid nephrotoxic agents, ensure adequate renal perfusion  GASTROINTESTINAL A:   At Risk Protein Calorie Malnutrition P:   NPO / OGT  Consider TF in am 7/31 Pepcid BID for SUP   HEMATOLOGIC A:   Anemia  Thrombocytopenia - suspect stress response P:  Trend CBC  Monitor for bleeding  Continue heparin SQ for DVT prophylaxis   INFECTIOUS A:   Concern for  Aspiration  P:   Monitor CXR  Assess blood cultures with hx of IVDA Monitor fever curve / WBC trend   ENDOCRINE A:   Hyperglycemia    P:   Insulin gtt   NEUROLOGIC A:   Acute Metabolic Encephalopathy - r/o anoxic injury post arrest  P:   RASS goal: -5 Fentanyl / Propofol for pain  / sedation  Continue Nimbex per protocol  Await rewarming for neuro exam > pending review, may need Neurology input      FAMILY  - Updates:  Father updated via phone 7/30  - Inter-disciplinary family meet or Palliative Care meeting due by:  8/5   Levy Pupa, MD, PhD 02/11/2017, 10:51 AM [facility] [phone] or if no answer [phone]

## 2017-02-10 NOTE — ED Notes (Addendum)
CCM placing central line. 

## 2017-02-10 NOTE — Progress Notes (Signed)
Pt's family including, mother, father, brother and sister in law at bedside. Education given to family on patients current status. Patients belongings given to family. Contact information updated in patients chart. Family has no further questions at this time.

## 2017-02-10 NOTE — Procedures (Signed)
Central line insertion  Indication: hypothermia post-cardiac arrest  Time out done and that was emergent procedure  Complete sterile condition Local injection of 5cc of lidocaine 1% US guided insertion of Rt IJ central line, no complications. CXR reviewed and central line is ok to use

## 2017-02-10 NOTE — Procedures (Signed)
Arterial Catheter Insertion Procedure Note Hector Dennis [id number] 01/22/1986  Procedure: Insertion of Arterial Catheter  Indications: Blood pressure monitoring  Procedure Details Consent: Unable to obtain consent because of altered level of consciousness. Time Out: Verified patient identification, verified procedure, site/side was marked, verified correct patient position, special equipment/implants available, medications/allergies/relevent history reviewed, required imaging and test results available.  Performed  Maximum sterile technique was used including antiseptics, cap, gloves, gown, hand hygiene, mask and sheet. Skin prep: Chlorhexidine; local anesthetic administered 20 gauge catheter was inserted into right radial artery using the Seldinger technique.  Evaluation Blood flow good; BP tracing good. Complications: No apparent complications.   Hector Dennis 02/10/2017

## 2017-02-10 NOTE — Progress Notes (Signed)
  Echocardiogram 2D Echocardiogram has been performed.  Janalyn HarderWest, Meleena Munroe R 02/10/2017, 11:34 AM

## 2017-02-10 NOTE — Progress Notes (Signed)
  Echocardiogram 2D Echocardiogram has been performed.  Hector Dennis, Hector Dennis 02/10/2017, 11:33 AM

## 2017-02-10 NOTE — Progress Notes (Signed)
[facility] with no complications.

## 2017-02-10 NOTE — Progress Notes (Signed)
Initial Nutrition Assessment  DOCUMENTATION CODES:   Not applicable  INTERVENTION:   If tube feeds initiated recommend Vital HP @15ml /hr  Prostat 60ml via OGT TID   Propofol: 32.4 ml/hr- provides 855kcal/day   Free water flushes 200 q 6hrs  Regimen provides 1815kcal/day, 122g/day protein, 1100ml free water    NUTRITION DIAGNOSIS:   Inadequate oral intake related to  (pt sedated and ventilated ) as evidenced by NPO status.  GOAL:   Provide needs based on ASPEN/SCCM guidelines  MONITOR:   Vent status, Labs, Weight trends, I & O's, Other (Comment) (TF initiation )  REASON FOR ASSESSMENT:   Ventilator    ASSESSMENT:   31 yo M with Hx of drug use presented to the hospital after PEA arrest for almost 15min with ROSC.   Pt sedated on vent. No family at bedside. Per RN, pt with a history of scoliosis. Cooling protocol in place. Unsure of wt history. OGT in place. No plans for tube feeds yet. Pt with high Phosphorus.     Medications reviewed and include: heparin, pepcid, fentanyl, levophed, propofol, dextrose  Labs reviewed: BUN 23(H), creat 1.5(H), iCa 1.06(L), P 9.2(H), Mg 2.0 wnl Lactic acid- 2.6(H) Wbc- 3.7(L) cbgs- 128, 135, 128, 164, 214  Patient is currently intubated on ventilator support MV: 13.9 L/min Temp (24hrs), Avg:91.5 F (33.1 C), Min:90.3 F (32.4 C), Max:92.5 F (33.6 C)  Propofol: 32.4 ml/hr- provides 855kcal/day   Nutrition-Focused physical exam completed. Findings are no fat depletion, no muscle depletion, and mild edema in BLE.   Diet Order:  Diet NPO time specified  Skin:  Reviewed, no issues  Last BM:  none since admit  Height:   Ht Readings from Last 1 Encounters:  02/10/17 6\' 2"  (1.88 m)    Weight:   Wt Readings from Last 1 Encounters:  02/10/17 199 lb 15.3 oz (90.7 kg)    Ideal Body Weight:  86.4 kg  BMI:  Body mass index is 25.67 kg/m.  Estimated Nutritional Needs:   Kcal:  1674kcal/day   Protein:  118-136g/day    Fluid:  >1.6L/day   EDUCATION NEEDS:   Education needs no appropriate at this time  Betsey Holidayasey Shanti Agresti MS, RD, LDN Pager #[phone]- [phone] After Hours Pager: [phone]

## 2017-02-10 NOTE — Progress Notes (Signed)
   Called "Dad" - per RN who gave number of [phone] LMTCB to a Darryl Chuang to call [phone] or [phone]    .Dr. Kalman ShanMurali Eulalie Speights, M.D., [facility].C.C.P Pulmonary and Critical Care Medicine Staff Physician [facility] [place] [facility] Pager: [phone], If no answer or between  15:00h - 7:00h: call [phone]  02/10/2017 3:07 AM

## 2017-02-10 NOTE — Progress Notes (Signed)
CRITICAL VALUE ALERT  Critical Value: Troponin 4.10  Date & Time Notied: 02/10/17  Provider Notified: Dr. Molli KnockYacoub  Orders Received/Actions taken: No orders given, will continue to monitor.

## 2017-02-10 NOTE — Progress Notes (Signed)
CSW informed by RN that pt father is assigned to [employer]ury Duty tomorrow and trying to get out of duty so he can be by his son.  CSW provided pt father with letter stating the reason for not being able to come for jury duty.  Encouraged pt father to call if the letter was not sufficient and I could provide whatever is needed.  Burna SisJenna H. Ceirra Belli, LCSW Clinical Social Worker [phone]

## 2017-02-10 NOTE — Consult Note (Signed)
Reason for ICU admission: cardiac arrest  In summary: 31 yo M with Hx of drug use presented to the hospital after PEA arrest for almost 15min with ROSC.  On my assessment: Patient hemodynamics improved after fluid boluse Pupils are dilated and non reactive. Patient intubated and non-responsive S1S2 regular, no murmur Lungs are clear on auscultation, no wheeze Abdomen soft not distended Ext: positive pulses, +1 edema. Patient has deformity at the right ankle could be fracture  I reviewed labs and imaging  Patient is critically ill in the ICU and I am managing the patient for the following 1. Post-cardiac arrest 2. Acute hypoxic resp failure 3. Metabolic and resp acidosis 4. Hypoglycemia 5. AKI 6. Possible intoxication  Plan: - initiate hypothermia protocol. - correct both metabolic and resp acidosis. Adjust vent settings, repeat ABGs - volume resuscitation, follow lactic acid trend - check drug screen - hypoglycemia protocol - replace electrolytes - patient on appropriate prophylaxis  I spent 45 min of critical care time managing the patient

## 2017-02-10 NOTE — Progress Notes (Signed)
PCCM Interval Note  I was able to reach the patient's father, Mr. Earlean PolkaDarryl Barnick, by telephone this morning. I informed him about the patient's presentation, current status. Explained hypothermia protocol to him. Also explained that the patient is at high risk for end organ injuries including a significant, unrecoverable neurological injury. He voiced understanding. He did have some questions about how his son was found, under what circumstances, and by whom. We will try to get him these answers if we are able. He is going to try to come to the hospital to see him. We will plan to undertake discussions about direction of care, continued support depending on neurological status after the patient is rewarmed.  Levy Pupaobert Dewey Neukam, MD, PhD 02/10/2017, 9:58 AM [place] [phone] or if no answer [phone]

## 2017-02-10 NOTE — Procedures (Signed)
ELECTROENCEPHALOGRAM REPORT  Date of Study: 02/10/2017  Patient's Name: Hector Dennis MRN: [id number] Date of Birth: 03-17-1986  Referring Provider: Dr. Oswaldo MilianAkram Zaaqoq  Clinical History: This is a 31 year old man with suspected overdose, s/p cardiac arrest, on hypothermia protocol  Medications: propofol (DIPRIVAN) 1000 MG/100ML infusion  fentaNYL 2500mcg in NS 250mL (4210mcg/ml) infusion-PREMIX  cisatracurium (NIMBEX) 200 mg in sodium chloride 0.9 % 200 mL (1 mg/mL) infusion  famotidine (PEPCID) IVPB 20 mg premix  heparin injection 5,000 Units  hydrALAZINE (APRESOLINE) injection 10-20 mg  insulin regular (NOVOLIN R,HUMULIN R) 100 Units in sodium chloride 0.9 % 100 mL (1 Units/mL) infusion  norepinephrine (LEVOPHED) 4 mg in dextrose 5 % 250 mL (0.016 mg/mL) infusion   Technical Summary: A multichannel digital EEG recording measured by the international 10-20 system with electrodes applied with paste and impedances below 5000 ohms performed in our laboratory with EKG monitoring in an intubated and sedated patient on hypothermia protocol at 33 degrees Celsius.  Hyperventilation and photic stimulation were not performed.  The digital EEG was referentially recorded, reformatted, and digitally filtered in a variety of bipolar and referential montages for optimal display.    Description: The patient is intubated and sedated on Propofol and Fentanyl during the recording. There is loss of normal background activity. The background shows a burst suppression pattern with 1- 5 second bursts of diffuse theta activity with sharply contoured activity over the frontal regions intermixed with diffuse background suppression lasting 20 to 180 seconds. There is no spontaneous reactivity or reactivity noted with stimulation. Hyperventilation and photic stimulation were not performed. There were no epileptiform discharges or electrographic seizures seen.   EKG lead was unremarkable.  Impression: This EEG is  markedly abnormal due to burst-suppression pattern.  Clinical Correlation: This record shows evidence of severe diffuse or bilateral cerebral dysfunction that is nonspecific in etiology, and can be seen with anoxic injury, toxic/metabolic encephalopathy, or medication effect from Propofol and Fentanyl. Sharply contoured frontal activity noted appears artifactual rather than epileptiform. There were no electrographic seizures seen in this study. Clinical correlation is advised   Patrcia DollyKaren Marshe Shrestha, M.D.

## 2017-02-11 ENCOUNTER — Inpatient Hospital Stay (HOSPITAL_COMMUNITY): Payer: Medicaid Other

## 2017-02-11 LAB — CBC
HCT: 32 % — ABNORMAL LOW (ref 39.0–52.0)
Hemoglobin: 11.6 g/dL — ABNORMAL LOW (ref 13.0–17.0)
MCH: 30.2 pg (ref 26.0–34.0)
MCHC: 36.3 g/dL — ABNORMAL HIGH (ref 30.0–36.0)
MCV: 83.3 fL (ref 78.0–100.0)
PLATELETS: 84 10*3/uL — AB (ref 150–400)
RBC: 3.84 MIL/uL — AB (ref 4.22–5.81)
RDW: 13.3 % (ref 11.5–15.5)
WBC: 5.5 10*3/uL (ref 4.0–10.5)

## 2017-02-11 LAB — BASIC METABOLIC PANEL
ANION GAP: 11 (ref 5–15)
ANION GAP: 10 (ref 5–15)
ANION GAP: 11 (ref 5–15)
ANION GAP: 10 (ref 5–15)
Anion gap: 10 (ref 5–15)
Anion gap: 11 (ref 5–15)
BUN: 33 mg/dL — AB (ref 6–20)
BUN: 33 mg/dL — ABNORMAL HIGH (ref 6–20)
BUN: 36 mg/dL — ABNORMAL HIGH (ref 6–20)
BUN: 40 mg/dL — ABNORMAL HIGH (ref 6–20)
BUN: 42 mg/dL — AB (ref 6–20)
BUN: 45 mg/dL — AB (ref 6–20)
CALCIUM: 7.2 mg/dL — AB (ref 8.9–10.3)
CALCIUM: 7.2 mg/dL — AB (ref 8.9–10.3)
CALCIUM: 7.2 mg/dL — AB (ref 8.9–10.3)
CALCIUM: 7.3 mg/dL — AB (ref 8.9–10.3)
CALCIUM: 7.8 mg/dL — AB (ref 8.9–10.3)
CO2: 12 mmol/L — ABNORMAL LOW (ref 22–32)
CO2: 13 mmol/L — AB (ref 22–32)
CO2: 14 mmol/L — ABNORMAL LOW (ref 22–32)
CO2: 13 mmol/L — AB (ref 22–32)
CO2: 14 mmol/L — ABNORMAL LOW (ref 22–32)
CO2: 15 mmol/L — ABNORMAL LOW (ref 22–32)
CREATININE: 2.31 mg/dL — AB (ref 0.61–1.24)
CREATININE: 2.37 mg/dL — AB (ref 0.61–1.24)
Calcium: 7.3 mg/dL — ABNORMAL LOW (ref 8.9–10.3)
Chloride: 107 mmol/L (ref 101–111)
Chloride: 107 mmol/L (ref 101–111)
Chloride: 109 mmol/L (ref 101–111)
Chloride: 109 mmol/L (ref 101–111)
Chloride: 109 mmol/L (ref 101–111)
Chloride: 110 mmol/L (ref 101–111)
Creatinine, Ser: 2.67 mg/dL — ABNORMAL HIGH (ref 0.61–1.24)
Creatinine, Ser: 3.05 mg/dL — ABNORMAL HIGH (ref 0.61–1.24)
Creatinine, Ser: 3.37 mg/dL — ABNORMAL HIGH (ref 0.61–1.24)
Creatinine, Ser: 3.74 mg/dL — ABNORMAL HIGH (ref 0.61–1.24)
GFR calc Af Amer: 23 mL/min — ABNORMAL LOW (ref >60)
GFR calc Af Amer: 26 mL/min — ABNORMAL LOW (ref >60)
GFR calc Af Amer: 29 mL/min — ABNORMAL LOW (ref >60)
GFR calc Af Amer: 41 mL/min — ABNORMAL LOW (ref >60)
GFR calc non Af Amer: 34 mL/min — ABNORMAL LOW (ref >60)
GFR calc non Af Amer: 35 mL/min — ABNORMAL LOW (ref >60)
GFR, EST AFRICAN AMERICAN: 40 mL/min — AB (ref >60)
GFR, EST AFRICAN AMERICAN: 34 mL/min — AB (ref >60)
GFR, EST NON AFRICAN AMERICAN: 30 mL/min — AB (ref >60)
GFR, EST NON AFRICAN AMERICAN: 25 mL/min — AB (ref >60)
GFR, EST NON AFRICAN AMERICAN: 22 mL/min — AB (ref >60)
GFR, EST NON AFRICAN AMERICAN: 20 mL/min — AB (ref >60)
GLUCOSE: 103 mg/dL — AB (ref 65–99)
GLUCOSE: 104 mg/dL — AB (ref 65–99)
GLUCOSE: 107 mg/dL — AB (ref 65–99)
GLUCOSE: 110 mg/dL — AB (ref 65–99)
Glucose, Bld: 102 mg/dL — ABNORMAL HIGH (ref 65–99)
Glucose, Bld: 102 mg/dL — ABNORMAL HIGH (ref 65–99)
POTASSIUM: 5.2 mmol/L — AB (ref 3.5–5.1)
POTASSIUM: 5.4 mmol/L — AB (ref 3.5–5.1)
POTASSIUM: 5 mmol/L (ref 3.5–5.1)
POTASSIUM: 5.6 mmol/L — AB (ref 3.5–5.1)
Potassium: 5 mmol/L (ref 3.5–5.1)
Potassium: 5.5 mmol/L — ABNORMAL HIGH (ref 3.5–5.1)
SODIUM: 133 mmol/L — AB (ref 135–145)
SODIUM: 132 mmol/L — AB (ref 135–145)
SODIUM: 132 mmol/L — AB (ref 135–145)
SODIUM: 134 mmol/L — AB (ref 135–145)
Sodium: 131 mmol/L — ABNORMAL LOW (ref 135–145)
Sodium: 133 mmol/L — ABNORMAL LOW (ref 135–145)

## 2017-02-11 LAB — GLUCOSE, CAPILLARY
GLUCOSE-CAPILLARY: 93 mg/dL (ref 65–99)
GLUCOSE-CAPILLARY: 151 mg/dL — AB (ref 65–99)
GLUCOSE-CAPILLARY: 94 mg/dL (ref 65–99)
Glucose-Capillary: 101 mg/dL — ABNORMAL HIGH (ref 65–99)
Glucose-Capillary: 108 mg/dL — ABNORMAL HIGH (ref 65–99)
Glucose-Capillary: 112 mg/dL — ABNORMAL HIGH (ref 65–99)

## 2017-02-11 LAB — POCT I-STAT, CHEM 8
BUN: 29 mg/dL — AB (ref 6–20)
CREATININE: 2.2 mg/dL — AB (ref 0.61–1.24)
Calcium, Ion: 1.01 mmol/L — ABNORMAL LOW (ref 1.15–1.40)
Chloride: 112 mmol/L — ABNORMAL HIGH (ref 101–111)
Glucose, Bld: 99 mg/dL (ref 65–99)
HCT: 26 % — ABNORMAL LOW (ref 39.0–52.0)
HEMOGLOBIN: 8.8 g/dL — AB (ref 13.0–17.0)
Potassium: 4.9 mmol/L (ref 3.5–5.1)
Sodium: 134 mmol/L — ABNORMAL LOW (ref 135–145)
TCO2: 13 mmol/L (ref 0–100)

## 2017-02-11 LAB — TRIGLYCERIDES: TRIGLYCERIDES: 345 mg/dL — AB (ref <150)

## 2017-02-11 LAB — BLOOD GAS, ARTERIAL
Acid-base deficit: 13.1 mmol/L — ABNORMAL HIGH (ref 0.0–2.0)
BICARBONATE: 12.2 mmol/L — AB (ref 20.0–28.0)
Drawn by: 51147
FIO2: 50
LHR: 22 {breaths}/min
MECHVT: 660 mL
O2 Saturation: 97.7 %
PEEP: 5 cmH2O
PO2 ART: 108 mmHg (ref 83.0–108.0)
Patient temperature: 92.3
pCO2 arterial: 22 mmHg — ABNORMAL LOW (ref 32.0–48.0)
pH, Arterial: 7.34 — ABNORMAL LOW (ref 7.350–7.450)

## 2017-02-11 LAB — MAGNESIUM: MAGNESIUM: 1.6 mg/dL — AB (ref 1.7–2.4)

## 2017-02-11 LAB — PHOSPHORUS: Phosphorus: 3.6 mg/dL (ref 2.5–4.6)

## 2017-02-11 MED ORDER — PROPOFOL 1000 MG/100ML IV EMUL
5.0000 ug/kg/min | INTRAVENOUS | Status: DC
  Administered 2017-02-11: 30 ug/kg/min via INTRAVENOUS
  Administered 2017-02-11: 20 ug/kg/min via INTRAVENOUS
  Administered 2017-02-12: 10 ug/kg/min via INTRAVENOUS
  Filled 2017-02-11 (×2): qty 100

## 2017-02-11 MED ORDER — SODIUM POLYSTYRENE SULFONATE 15 GM/60ML PO SUSP
45.0000 g | Freq: Once | ORAL | Status: AC
  Administered 2017-02-11: 45 g via ORAL
  Filled 2017-02-11: qty 180

## 2017-02-11 MED ORDER — SODIUM CHLORIDE 0.9 % IV SOLN
3.0000 g | Freq: Three times a day (TID) | INTRAVENOUS | Status: DC
  Administered 2017-02-11 (×2): 3 g via INTRAVENOUS
  Filled 2017-02-11 (×3): qty 3

## 2017-02-11 MED ORDER — SODIUM POLYSTYRENE SULFONATE 15 GM/60ML PO SUSP
30.0000 g | Freq: Once | ORAL | Status: AC
  Administered 2017-02-11: 30 g
  Filled 2017-02-11: qty 120

## 2017-02-11 MED ORDER — SODIUM CHLORIDE 0.9 % IV SOLN
3.0000 g | Freq: Two times a day (BID) | INTRAVENOUS | Status: DC
  Administered 2017-02-12: 3 g via INTRAVENOUS
  Filled 2017-02-11 (×2): qty 3

## 2017-02-11 MED ORDER — SODIUM CHLORIDE 0.9 % IV BOLUS (SEPSIS)
2000.0000 mL | Freq: Once | INTRAVENOUS | Status: AC
  Administered 2017-02-11: 2000 mL via INTRAVENOUS

## 2017-02-11 MED ORDER — DOCUSATE SODIUM 50 MG/5ML PO LIQD
100.0000 mg | Freq: Two times a day (BID) | ORAL | Status: DC
  Administered 2017-02-11: 100 mg
  Filled 2017-02-11 (×2): qty 10

## 2017-02-11 MED ORDER — FAMOTIDINE IN NACL 20-0.9 MG/50ML-% IV SOLN
20.0000 mg | INTRAVENOUS | Status: DC
  Administered 2017-02-12: 20 mg via INTRAVENOUS
  Filled 2017-02-11: qty 50

## 2017-02-11 NOTE — Progress Notes (Signed)
eLink Physician-Brief Progress Note Patient Name: Hector Dennis DOB: 05/16/1986 MRN: [id number]   Date of Service  02/11/2017  HPI/Events of Note  Hyperkalemia  eICU Interventions  Kayexalate and colace ordered     Intervention Category Major Interventions: Electrolyte abnormality - evaluation and management  YACOUB,WESAM 02/11/2017, 8:25 PM

## 2017-02-11 NOTE — Progress Notes (Signed)
Noted patient to have myoclonus/seizure activity. MD Molli KnockYacoub notified. Propofol gtt reordered, EEG ordered for am. Will continue to monitor.

## 2017-02-11 NOTE — Progress Notes (Addendum)
eLink Physician-Brief Progress Note Patient Name: Hector Dennis Rastetter DOB: 04/14/1986 MRN: [id number]   eLink Physician Progress Note and Electrolyte Replacement  Patient Name: Hector Dennis Lerch DOB: 04/22/1986 MRN: [id number]  Date of Service  02/11/2017   HPI/Events of Note    Recent Labs Lab 02/10/17 0220  02/10/17 1134 02/10/17 1552 02/10/17 1957 02/10/17 2344 02/11/17 0154 02/11/17 0342  NA  --   < > 130* 132* 130* 131* 134* 133*  K  --   < > 4.4 4.4 4.6 5.0 4.9 5.5*  CL  --   < > 106 108 107 107 112* 110  CO2  --   --  15* 15* 13* 13*  --  13*  GLUCOSE  --   < > 222* 159* 114* 107* 99 102*  BUN  --   < > 27* 29* 31* 33* 29* 33*  CREATININE  --   < > 1.93* 1.95* 2.11* 2.31* 2.20* 2.37*  CALCIUM  --   --  7.5* 7.6* 7.6* 7.8*  --  7.2*  MG 2.0  --   --   --   --   --   --  1.6*  PHOS 9.2*  --   --   --   --   --   --  3.6  < > = values in this interval not displayed.  Estimated Creatinine Clearance: 51.5 mL/min (A) (by Dennis-G formula based on SCr of 2.37 mg/dL (H)).  Intake/Output      07/30 0701 - 07/31 0700   I.V. (mL/kg) 3158.4 (34.2)   IV Piggyback 2100   Total Intake(mL/kg) 5258.4 (56.9)   Urine (mL/kg/hr) 670 (0.3)   Total Output 670   Net +4588.4        - I/O DETAILED x 24h    Total I/O In: 3442.5 [I.V.:1392.5; IV Piggyback:2050] Out: 160 [Urine:160] - I/O THIS SHIFT    ASSESSMENT Rewarming Mild hyperkalemia Oliguria + worseninng AKI - > ? ATN desite fluid bolus Hypomag  eICURN Interventions  Kayexalate Hold off mag supplemet due to rewarming   ASSESSMENT: MAJOR ELECTROLYTE      Dr. Kalman ShanMurali Verdie Barrows, M.D., [facility]Dennis.Dennis.P Pulmonary and Critical Care Medicine Staff Physician [facility] [place] [facility] Pager: [phone], If no answer or between  15:00h - 7:00h: call [phone]  02/11/2017 5:13 AM       Intervention Category Major Interventions: Electrolyte abnormality - evaluation and  management  Jeannelle Wiens 02/11/2017, 5:12 AM

## 2017-02-11 NOTE — Progress Notes (Signed)
eLink Physician-Brief Progress Note Patient Name: Hector Dennis DOB: 04/06/1986 MRN: [id number]   Date of Service  02/11/2017  HPI/Events of Note  oliguria  eICU Interventions  2L fluid bolus     Intervention Category Intermediate Interventions: Oliguria - evaluation and management  Dorothyann Mourer 02/11/2017, 12:42 AM

## 2017-02-11 NOTE — Progress Notes (Signed)
Wasted 175 ccs of fentanyl down sink witnessed by Devota PaceKaylee Quick, RN.

## 2017-02-11 NOTE — Progress Notes (Signed)
Talked with the nurse. EEG will be in the morning. Pt is currently back on sedation.

## 2017-02-11 NOTE — Progress Notes (Signed)
PULMONARY / CRITICAL CARE MEDICINE   Name: Hector Dennis MRN: [id number] DOB: 06/08/1986    ADMISSION DATE:  14-Feb-2017 CONSULTATION DATE:  02/14/2017  REFERRING MD:  Dr. Manus Gunningancour    CHIEF COMPLAINT:  Cardiac Arrest - Asystol initial rhythm   BRIEF SUMMARY:  [name] admitted 7/29 after being found down in a hotel room with suspected heroin overdose.  Initial rhythm asystole > CPR, epi, narcan x 15 minutes with rhythm change to PEA > ROSC after approximately 15 minutes ACLS. Therapeutic temperature management initiated on admit.    SUBJECTIVE:  Undergoing rewarming process now Has been oliguric last 24h, 700cc out total   VITAL SIGNS: BP 130/75   Pulse (!) 106   Temp (!) 95.9 F (35.5 C) (Core (Comment))   Resp 18   Ht 6\' 2"  (1.88 m)   Wt 92.4 kg (203 lb 11.3 oz) Comment: with pads on  SpO2 97%   BMI 26.15 kg/m   HEMODYNAMICS: CVP:  [5 mmHg-15 mmHg] 9 mmHg  VENTILATOR SETTINGS: Vent Mode: PRVC FiO2 (%):  [40 %-60 %] 40 % Set Rate:  [18 bmp-22 bmp] 18 bmp Vt Set:  [600 mL-660 mL] 600 mL PEEP:  [5 cmH20] 5 cmH20 Plateau Pressure:  [20 cmH20-22 cmH20] 21 cmH20  INTAKE / OUTPUT: I/O last 3 completed shifts: In: 10328.5 [I.V.:5068.5; IV Piggyback:5260] Out: 980 [Urine:980]  PHYSICAL EXAMINATION: General: Ill-appearing man, mechanically ventilated HEENT: ET tube in place, no oral lesions Neuro: Remains on sedation, no paralytics at this time, unresponsive to voice, no spontaneous movement CV: Regular, no murmur PULM: Coarse breath sounds bilaterally GI: Soft, benign, positive bowel sounds Extremities: No edema Skin: No rash   LABS:  BMET  Recent Labs Lab 02/10/17 2344 02/11/17 0154 02/11/17 0342 02/11/17 0725  NA 131* 134* 133* 132*  K 5.0 4.9 5.5* 5.2*  CL 107 112* 110 109  CO2 13*  --  13* 12*  BUN 33* 29* 33* 36*  CREATININE 2.31* 2.20* 2.37* 2.67*  GLUCOSE 107* 99 102* 102*    Electrolytes  Recent Labs Lab 02/10/17 0220  02/10/17 2344  02/11/17 0342 02/11/17 0725  CALCIUM  --   < > 7.8* 7.2* 7.2*  MG 2.0  --   --  1.6*  --   PHOS 9.2*  --   --  3.6  --   < > = values in this interval not displayed.  CBC  Recent Labs Lab 02/10/17 0111  02/10/17 0220  02/10/17 0743 02/11/17 0154 02/11/17 0342  WBC 4.1  --  3.7*  --   --   --  5.5  HGB 10.8*  < > 11.1*  < > 10.2* 8.8* 11.6*  HCT 31.8*  < > 32.0*  < > 30.0* 26.0* 32.0*  PLT 117*  --  109*  --   --   --  84*  < > = values in this interval not displayed.  Coag's  Recent Labs Lab 008/09/2016 2258 02/10/17 0111 02/10/17 0220 02/10/17 1134  APTT 51* 56* 52*  --   INR 1.61 2.06 2.14 2.15    Sepsis Markers  Recent Labs Lab 008/09/2016 2314 02/10/17 0635 02/10/17 0831  LATICACIDVEN 14.73* 2.6* 2.1*    ABG  Recent Labs Lab 02/10/17 0253 02/10/17 0601 02/11/17 0445  PHART 7.224* 7.309* 7.340*  PCO2ART 34.6 27.9* 22.0*  PO2ART 56.0* 67.0* 108    Liver Enzymes No results for input(s): AST, ALT, ALKPHOS, BILITOT, ALBUMIN in the last 168 hours.  Cardiac  Enzymes  Recent Labs Lab 02/10/17 0220 02/10/17 1156 02/10/17 1715  TROPONINI 0.84* 3.80* 4.10*    Glucose  Recent Labs Lab 02/10/17 2059 02/10/17 2153 02/10/17 2258 02/10/17 2349 02/11/17 0349 02/11/17 0757  GLUCAP 105* 105* 102* 99 101* 93    Imaging Dg Chest Port 1 View  Result Date: 02/11/2017 CLINICAL DATA:  Hypoxia EXAM: PORTABLE CHEST 1 VIEW COMPARISON:  February 10, 2017 FINDINGS: Endotracheal tube tip is 2.9 cm above the carina. Central catheter tip is in the superior vena cava. Nasogastric tube bulla tip and side port are in the stomach. No pneumothorax. There is interstitial and patchy alveolar edema with left pleural effusion. Heart is mildly enlarged with pulmonary venous hypertension. No adenopathy. No bone lesions. IMPRESSION: Tube and catheter positions as described without pneumothorax. There is currently evidence congestive heart failure. Opacity in the left lower lobe is  probably due to alveolar edema, although a degree of superimposed pneumonia in the left base cannot be excluded radiographically. Electronically Signed   By: Bretta Bang III M.D.   On: 02/11/2017 07:42     STUDIES:  7/29  CT Head / Cervical Spine >> no acute intracranial abnormality, mild paranasal sinus disease, no acute fracture or dislocation of cervical spine, partially visualized consolidation in the left lung apex (?edema vs PNA) 7/30  ECHO >> normal LV size, mild LVH, LVEF 60-65%, normal wall motion, mild MR, mild RA/RV dilatation, PA peak pressure   CULTURES: BCx2 7/30 >>  Resp cx 7/31 >>   ANTIBIOTICS: unasyn 7/31 >>   SIGNIFICANT EVENTS: 7/29  Admit after cardiac arrest with suspected heroin overdose, hypothermia protocol   LINES/TUBES: ETT 7/29 >>  TLC 7/30 >>  R Radial Aline 7/30 >>   DISCUSSION: [name] admitted 7/29 after being found down at a hotel with suspected heroin overdose in cardiac arrest. Approximately 15 minutes of CPR before ROSC.   ASSESSMENT / PLAN:  PULMONARY A: Acute Hypoxic Respiratory Failure - in setting of cardiac arrest, possible aspiration, +/- pulmonary edema.   Bilateral Infiltrates / Rule Out Aspiration PNA - Hx of vomiting prior to arrival but CXR appears more consistent with edema Mild Elevation of PA pressure - noted on ECHO 7/30 P:   PRVC 6cc/kg Determine ability to undertake spine drainage breathing trial based on neurological recovery after rewarming Start empiric antibiotics as below Follow chest xray Wean PEEP / FiO2 for sats > 92% VAP prevention orders  CARDIOVASCULAR A:  Cardiac Arrest - initial rhythm asystole, ~ 15 min CPR per EMS.   Elevated Troponin - suspect post arrest IVDA  P:  Hypothermia protocol, currently in the rewarming process Paralytics and sedation until target temperature Norepinephrine for goal MAP greater than 80 Hydralazine prn  RENAL A:   Acute Kidney Injury - in setting of  cardiac arrest / hypoperfusion/ATN Hyponatremia  Mild Elevation of Lactic Acid  Hyperkalemia P:   Kayexalate given, repeat based on potassium trans- Follow BMP, urine output with return of renal perfusion Replace electrolytes as indicated    GASTROINTESTINAL A:   At Risk Protein Calorie Malnutrition P:   Nothing by mouth Pepcid for SU P Initiate tube feeding once paralytics lifted   HEMATOLOGIC A:   Anemia  Thrombocytopenia - suspect stress response P:  Follow CBC Continue subcutaneous heparin for DVT prophylaxis  INFECTIOUS A:   Concern for Aspiration  P:   Follow chest x-ray Start empiric Unasyn on 7/31 Check respiratory culture 7/31 Follow blood cultures given history  of IV drug abuse  ENDOCRINE A:   Hyperglycemia    P:   Subcutaneous insulin per protocol Lantus 10 units  NEUROLOGIC A:   Acute Metabolic Encephalopathy - r/o anoxic injury post arrest  P:   RASS goal: -5 Fentanyl / Propofol for pain  / sedation  Continue Nimbex per protocol  Should be at goal rewarming temperature midday 7/31. Stop paralytics and sedation at this time to assess neurological function Consider EEG, head CT should these be necessary/indicated for neurological prognosis.     FAMILY  - Updates:  Father updated via phone 7/30, at bedside 7/31  - Inter-disciplinary family meet or Palliative Care meeting due by:  8/5  Independent CC time 35 minutes.   Levy Pupaobert Tejasvi Brissett, MD, PhD 02/11/2017, 10:51 AM [place] [facility] [phone] or if no answer [phone]

## 2017-02-11 NOTE — Progress Notes (Signed)
Pharmacy Antibiotic Note  Hector Dennis is a 31 y.o. male admitted on 2016/09/19 after being found unresponsive in a hotel.  Pharmacy has been consulted for Unasyn dosing for aspiration PNA.  Patient's renal function is worsening.  He is rewarmed and his WBC is WNL.   Plan: Unasyn 3gm IV Q8H Monitor renal fxn, clinical progress F/U nutrition, pltc (on heparin SQ), reducing Lantus    Height: 6\' 2"  (188 cm) Weight: 203 lb 11.3 oz (92.4 kg) (with pads on) IBW/kg (Calculated) : 82.2  Temp (24hrs), Avg:92.3 F (33.5 C), Min:90.5 F (32.5 C), Max:96.3 F (35.7 C)   Recent Labs Lab 2016/12/19 2258 2016/12/19 2314  02/10/17 0111  02/10/17 0220  02/10/17 [id number] 02/10/17 0831  02/10/17 1957 02/10/17 2344 02/11/17 0154 02/11/17 0342 02/11/17 0725  WBC 9.6  --   --  4.1  --  3.7*  --   --   --   --   --   --   --  5.5  --   CREATININE 2.00*  --   < > 1.82*  < >  --   < >  --   --   < > 2.11* 2.31* 2.20* 2.37* 2.67*  LATICACIDVEN  --  14.73*  --   --   --   --   --  2.6* 2.1*  --   --   --   --   --   --   < > = values in this interval not displayed.  Estimated Creatinine Clearance: 45.8 mL/min (A) (by C-G formula based on SCr of 2.67 mg/dL (H)).    No Known Allergies   Unasyn 7/31 >>  7/30 BCx - 7/30 MRSA PCR - negative    Alaiah Lundy D. Laney Potashang, PharmD, BCPS Pager:  [phone] 02/11/2017, 11:05 AM

## 2017-02-12 ENCOUNTER — Inpatient Hospital Stay (HOSPITAL_COMMUNITY): Payer: Medicaid Other

## 2017-02-12 DIAGNOSIS — G931 Anoxic brain damage, not elsewhere classified: Secondary | ICD-10-CM

## 2017-02-12 DIAGNOSIS — J9600 Acute respiratory failure, unspecified whether with hypoxia or hypercapnia: Secondary | ICD-10-CM

## 2017-02-12 LAB — GLUCOSE, CAPILLARY
GLUCOSE-CAPILLARY: 108 mg/dL — AB (ref 65–99)
GLUCOSE-CAPILLARY: 98 mg/dL (ref 65–99)
GLUCOSE-CAPILLARY: 96 mg/dL (ref 65–99)

## 2017-02-12 LAB — CBC
HCT: 30.8 % — ABNORMAL LOW (ref 39.0–52.0)
Hemoglobin: 10.7 g/dL — ABNORMAL LOW (ref 13.0–17.0)
MCH: 29.7 pg (ref 26.0–34.0)
MCHC: 34.7 g/dL (ref 30.0–36.0)
MCV: 85.6 fL (ref 78.0–100.0)
PLATELETS: 101 10*3/uL — AB (ref 150–400)
RBC: 3.6 MIL/uL — ABNORMAL LOW (ref 4.22–5.81)
RDW: 14.1 % (ref 11.5–15.5)
WBC: 6.1 10*3/uL (ref 4.0–10.5)

## 2017-02-12 LAB — BLOOD GAS, ARTERIAL
Acid-base deficit: 13.7 mmol/L — ABNORMAL HIGH (ref 0.0–2.0)
Bicarbonate: 12 mmol/L — ABNORMAL LOW (ref 20.0–28.0)
DRAWN BY: 511851
FIO2: 60
O2 Saturation: 98.3 %
PATIENT TEMPERATURE: 98.8
PCO2 ART: 27.8 mmHg — AB (ref 32.0–48.0)
PEEP: 5 cmH2O
PH ART: 7.259 — AB (ref 7.350–7.450)
PO2 ART: 136 mmHg — AB (ref 83.0–108.0)
RATE: 18 resp/min
VT: 660 mL

## 2017-02-12 LAB — BASIC METABOLIC PANEL
ANION GAP: 7 (ref 5–15)
ANION GAP: 11 (ref 5–15)
BUN: 48 mg/dL — ABNORMAL HIGH (ref 6–20)
BUN: 50 mg/dL — AB (ref 6–20)
CALCIUM: 7.2 mg/dL — AB (ref 8.9–10.3)
CO2: 15 mmol/L — ABNORMAL LOW (ref 22–32)
CO2: 19 mmol/L — ABNORMAL LOW (ref 22–32)
CREATININE: 4.7 mg/dL — AB (ref 0.61–1.24)
Calcium: 7.4 mg/dL — ABNORMAL LOW (ref 8.9–10.3)
Chloride: 106 mmol/L (ref 101–111)
Chloride: 109 mmol/L (ref 101–111)
Creatinine, Ser: 4.25 mg/dL — ABNORMAL HIGH (ref 0.61–1.24)
GFR calc Af Amer: 17 mL/min — ABNORMAL LOW (ref >60)
GFR calc non Af Amer: 15 mL/min — ABNORMAL LOW (ref >60)
GFR, EST AFRICAN AMERICAN: 20 mL/min — AB (ref >60)
GFR, EST NON AFRICAN AMERICAN: 17 mL/min — AB (ref >60)
GLUCOSE: 113 mg/dL — AB (ref 65–99)
Glucose, Bld: 116 mg/dL — ABNORMAL HIGH (ref 65–99)
Potassium: 5 mmol/L (ref 3.5–5.1)
Potassium: 5.5 mmol/L — ABNORMAL HIGH (ref 3.5–5.1)
Sodium: 132 mmol/L — ABNORMAL LOW (ref 135–145)
Sodium: 135 mmol/L (ref 135–145)

## 2017-02-12 LAB — CULTURE, RESPIRATORY

## 2017-02-12 LAB — CULTURE, RESPIRATORY W GRAM STAIN

## 2017-02-12 MED ORDER — MORPHINE BOLUS VIA INFUSION
5.0000 mg | INTRAVENOUS | Status: DC | PRN
  Filled 2017-02-12: qty 20

## 2017-02-12 MED ORDER — SODIUM POLYSTYRENE SULFONATE 15 GM/60ML PO SUSP
45.0000 g | Freq: Once | ORAL | Status: AC
Start: 2017-02-12 — End: 2017-02-12
  Administered 2017-02-12: 45 g
  Filled 2017-02-12: qty 180

## 2017-02-12 MED ORDER — SODIUM CHLORIDE 0.9 % IV SOLN
10.0000 mg/h | INTRAVENOUS | Status: DC
  Administered 2017-02-12: 10 mg/h via INTRAVENOUS
  Filled 2017-02-12: qty 10

## 2017-02-12 MED ORDER — STERILE WATER FOR INJECTION IV SOLN
INTRAVENOUS | Status: DC
  Administered 2017-02-12: 13:00:00 via INTRAVENOUS
  Filled 2017-02-12: qty 850

## 2017-02-12 MED ORDER — METOPROLOL TARTRATE 5 MG/5ML IV SOLN
2.5000 mg | INTRAVENOUS | Status: DC | PRN
  Administered 2017-02-12: 5 mg via INTRAVENOUS
  Filled 2017-02-12: qty 5

## 2017-02-12 DEATH — deceased

## 2017-02-15 LAB — CULTURE, BLOOD (ROUTINE X 2)
CULTURE: NO GROWTH
Culture: NO GROWTH
SPECIAL REQUESTS: ADEQUATE
Special Requests: ADEQUATE

## 2017-03-15 NOTE — Progress Notes (Signed)
eLink Physician-Brief Progress Note Patient Name: Hector Dennis DOB: 11/05/1985 MRN: [id number]   Date of Service  03/02/2017  HPI/Events of Note  Pt remains hyperkalemic (5.5) and anuric   eICU Interventions  Give another dose of kayexalate     Intervention Category Major Interventions: Electrolyte abnormality - evaluation and management  Donyale Berthold 03/10/2017, 1:03 AM

## 2017-03-15 NOTE — Progress Notes (Signed)
Chaplain providing continual support for this family who is saddened about the condition of this patient, their loved one.  Visiting today are the patient's parents and a couple of cousins.  Chaplain sat with them and allowed them space to reflect, make meaning and be hopeful.  Chaplain will remain available for this patient, family and staff.    Chaplain would like to thank the medical team for their care for this patient and family.    17-Jan-2017 1221  Clinical Encounter Type  Visited With Patient and family together  Visit Type Follow-up;Psychological support;Spiritual support;Social support  Referral From Chaplain  Consult/Referral To Chaplain  Recommendations (Continued Support)

## 2017-03-15 NOTE — Progress Notes (Signed)
PULMONARY / CRITICAL CARE MEDICINE   Name: RUTHERFORD ALARIE MRN: [id number] DOB: [birth year]    ADMISSION DATE:  01/23/2017 CONSULTATION DATE:  01/28/2017  REFERRING MD:  Dr. Manus Gunning    CHIEF COMPLAINT:  Cardiac Arrest - Asystol initial rhythm   BRIEF SUMMARY:  [name] admitted 7/29 after being found down in a hotel room with suspected heroin overdose.  Initial rhythm asystole > CPR, epi, narcan x 15 minutes with rhythm change to PEA > ROSC after approximately 15 minutes ACLS. Therapeutic temperature management initiated on admit.    SUBJECTIVE:  Rewarmed, off sedation, comatose.  VITAL SIGNS: BP (!) 157/91   Pulse (!) 118   Temp 99 F (37.2 C) (Core (Comment))   Resp (!) 28   Ht 6\' 2"  (1.88 m)   Wt 93.3 kg (205 lb 11 oz)   SpO2 100%   BMI 26.41 kg/m   HEMODYNAMICS: CVP:  [11 mmHg-13 mmHg] 13 mmHg  VENTILATOR SETTINGS: Vent Mode: PRVC FiO2 (%):  [40 %-100 %] 60 % Set Rate:  [18 bmp] 18 bmp Vt Set:  [660 mL] 660 mL PEEP:  [5 cmH20] 5 cmH20 Plateau Pressure:  [15 cmH20-25 cmH20] 24 cmH20  INTAKE / OUTPUT: I/O last 3 completed shifts: In: 6930.8 [I.V.:4030.8; NG/GT:600; IV Piggyback:2300] Out: 870 [Urine:420; Emesis/NG output:450]  PHYSICAL EXAMINATION: General:acutely ill young male in NAD HEENT: ET tube in place, no oral lesions Neuro: Comatose no sedation. Myoclonus CV: Regular, no murmur PULM: Coarse GI: Soft, benign, positive bowel sounds Extremities: No edema Skin: No rash   LABS:  BMET  Recent Labs Lab 02/11/17 1930 02/11/17 2342 02/25/17 0325  NA 134* 132* 135  K 5.6* 5.5* 5.0  CL 109 106 109  CO2 15* 19* 15*  BUN 45* 48* 50*  CREATININE 3.74* 4.25* 4.70*  GLUCOSE 104* 113* 116*    Electrolytes  Recent Labs Lab 02/10/17 0220  02/11/17 0342  02/11/17 1930 02/11/17 2342 02/25/17 0325  CALCIUM  --   < > 7.2*  < > 7.3* 7.2* 7.4*  MG 2.0  --  1.6*  --   --   --   --   PHOS 9.2*  --  3.6  --   --   --   --   < > = values in this interval not  displayed.  CBC  Recent Labs Lab 02/10/17 0220  02/11/17 0154 02/11/17 0342 02-25-17 0325  WBC 3.7*  --   --  5.5 6.1  HGB 11.1*  < > 8.8* 11.6* 10.7*  HCT 32.0*  < > 26.0* 32.0* 30.8*  PLT 109*  --   --  84* 101*  < > = values in this interval not displayed.  Coag's  Recent Labs Lab 01/15/2017 2258 02/10/17 0111 02/10/17 0220 02/10/17 1134  APTT 51* 56* 52*  --   INR 1.61 2.06 2.14 2.15    Sepsis Markers  Recent Labs Lab 02/08/2017 2314 02/10/17 0635 02/10/17 0831  LATICACIDVEN 14.73* 2.6* 2.1*    ABG  Recent Labs Lab 02/10/17 0253 02/10/17 0601 02/11/17 0445  PHART 7.224* 7.309* 7.340*  PCO2ART 34.6 27.9* 22.0*  PO2ART 56.0* 67.0* 108    Liver Enzymes No results for input(s): AST, ALT, ALKPHOS, BILITOT, ALBUMIN in the last 168 hours.  Cardiac Enzymes  Recent Labs Lab 02/10/17 0220 02/10/17 1156 02/10/17 1715  TROPONINI 0.84* 3.80* 4.10*    Glucose  Recent Labs Lab 02/11/17 1202 02/11/17 1554 02/11/17 1934 02/11/17 2348 2017/02/25 0334 2017-02-25 4098  GLUCAP 151* 108* 94 112* 108* 98    Imaging Dg Chest Port 1 View  Result Date: 02/15/2017 CLINICAL DATA:  Respiratory failure. EXAM: PORTABLE CHEST 1 VIEW COMPARISON:  02/11/2017. FINDINGS: Endotracheal tube, NG tube, right IJ line in stable position. Cardiomegaly with bilateral pulmonary infiltrates/ infiltrates, left side greater than right. These findings have progressed from prior exam . Bibasilar atelectasis. Small bilateral pleural effusions. No pneumothorax. IMPRESSION: 1. Lines and tubes in stable position. 2. Cardiomegaly with bilateral pulmonary infiltrates/edema, particularly prominent on the left. These findings have worsened from prior exam. Small pleural effusions also noted Electronically Signed   By: Maisie Fushomas  Register   On: September 12, 2016 06:36     STUDIES:  7/29  CT Head / Cervical Spine >> no acute intracranial abnormality, mild paranasal sinus disease, no acute fracture or  dislocation of cervical spine, partially visualized consolidation in the left lung apex (?edema vs PNA) 7/30  ECHO >> normal LV size, mild LVH, LVEF 60-65%, normal wall motion, mild MR, mild RA/RV dilatation, PA peak pressure 33mmHg   CULTURES: BCx2 7/30 >>  Resp cx 7/31 >>   ANTIBIOTICS: unasyn 7/31 >>   SIGNIFICANT EVENTS: 7/29  Admit after cardiac arrest with suspected heroin overdose, hypothermia protocol   LINES/TUBES: ETT 7/29 >>  TLC 7/30 >>  R Radial Aline 7/30 >>   DISCUSSION: [name] admitted 7/29 after being found down at a hotel with suspected heroin overdose in cardiac arrest. Approximately 15 minutes of CPR before ROSC.   ASSESSMENT / PLAN:  PULMONARY A: Acute Hypoxic Respiratory Failure - in setting of cardiac arrest, possible aspiration, +/- pulmonary edema.   Bilateral Infiltrates / Rule Out Aspiration PNA - Hx of vomiting prior to arrival but CXR appears more consistent with edema Mild Elevation of PA pressure - noted on ECHO 7/30 P:   PRVC 6cc/kg Start empiric antibiotics as below Follow chest xray Wean PEEP / FiO2 for sats > 92% VAP prevention orders Not candidate for SBT Repeat ABG  CARDIOVASCULAR A:  Cardiac Arrest - initial rhythm asystole, ~ 15 min CPR per EMS.   Elevated Troponin - suspect post arrest IVDA  P:  Tele Keep SBP < 180mmHg, keep MAP > 65mmHg Norepinephrine for goal MAP greater than 80 Hydralazine prn, metoprolol PRN  RENAL A:   Acute Kidney Injury - in setting of cardiac arrest / hypoperfusion/ATN. Worseing Hyponatremia  Mild Elevation of Lactic Acid  Hyperkalemia NAG acidosis P:   Kayexalate given, repeat K better Follow BMP, urine output  Bicarb infusion start at 6250mL/Hr Replace electrolytes as indicated   GASTROINTESTINAL A:   At Risk Protein Calorie Malnutrition P:   Nothing by mouth Pepcid for SUP Holding TF for EOL decisions.   HEMATOLOGIC A:   Anemia  Thrombocytopenia - suspect stress response P:   Follow CBC Continue subcutaneous heparin for DVT prophylaxis  INFECTIOUS A:   Concern for Aspiration (GBS on culture) P:   Follow chest x-ray Continue unasyn Unasyn on 7/31 > Check respiratory culture 7/31 Follow blood cultures given history of IV drug abuse  ENDOCRINE A:   Hyperglycemia    P:   Subcutaneous insulin per protocol Lantus 10 units  NEUROLOGIC A:   Acute Metabolic Encephalopathy - r/o anoxic injury post arrest  P:   RASS goal: -5 Sedation off Continue Nimbex per protocol  Consider EEG, head CT should these be necessary/indicated for neurological prognosis.  FAMILY  - Updates: Family updated by me 8/1  - Inter-disciplinary family meet  or Palliative Care meeting due by:  8/5   Joneen RoachPaul Hoffman, AGACNP-BC [facility] Pager [phone] or [phone]  02/26/2017 12:18 PM  Attending Note:  I have examined patient, reviewed labs, studies and notes. I have discussed the case with Henreitta LeberP Hoffman, and I agree with the data and plans as amended above. 31 year old man admitted 3/29 after being found down PNA. Suspected narcotics overdose. He underwent hypothermia and achieved normal temperature 7/31 midday. He has been comatose off sedation, has exhibited myoclonus. He was started empirically on Unasyn 7/31 for possible aspiration pneumonia. EEG is to be performed today. On Monday way should he is comatose, completely unresponsive. He has twitching of his right neck and shoulder. Lungs are coarse bilaterally. Heart regular without a murmur. Abdomen benign with positive bowel sounds. He has been oliguric. Labs indicate progressive acute renal failure. Hyperkalemia improved after Kayexalate. We will initiate bicarbonate today. Plan to review neurological status, discuss prognosis and ultimately goals of care with his father and stepmother once we have EEG information. Unfortunately suspect that prognosis here is dismal for meaningful neurological recovery.  Independent critical care time is 35 minutes.   Levy Pupaobert Tywanda Rice, MD, PhD 03/03/2017, 1:46 PM [facility] [phone] or if no answer [phone]

## 2017-03-15 NOTE — Progress Notes (Signed)
Chaplain received consult for this patient.  Family surrounding patient and provided parents of patient with support.  Family is open for prayer of comfort as they love on one another and love on Pennie RushingSeth during this time.  Chaplain will share with on call Chaplain for further support.    Chaplain would like to thank the medical team for their care for this patient and family.    03/03/2017 1729  Clinical Encounter Type  Visited With Patient and family together  Visit Type Follow-up;Spiritual support;Patient actively dying  Spiritual Encounters  Spiritual Needs Prayer

## 2017-03-15 NOTE — Progress Notes (Signed)
Time of death called by Ann MakiMegan Bailey RN and Oren BracketAmanda Laryssa Hassing RN at 1726 no heart sounds ausculted for one minute.  Family at bedside

## 2017-03-15 NOTE — Progress Notes (Signed)
Chaplain visited mom and dad of the patient who are having a challenging time with the situation around their son, the patient.  Chaplain gave space and respect as they shared their feelings and thoughts.  Dad requested prayer. Chaplain prayed with them and allowed more space from reflection from them.  Chaplain will continue to support and care for them as needed.    02/11/17 1130  Clinical Encounter Type  Visited With Family  Visit Type Initial;Psychological support;Spiritual support;Social support  Recommendations Fish farm manager(Continual Support)  Spiritual Encounters  Spiritual Needs Prayer

## 2017-03-15 NOTE — Progress Notes (Signed)
Morphine [id number]-[id number] wasted in sink. Witnessed by Oren BracketAmanda Shayona Hibbitts RN and Orene DesanctisGreg Dickson RN.

## 2017-03-15 NOTE — Discharge Summary (Signed)
PULMONARY / CRITICAL CARE MEDICINE   Name: Hector Dennis MRN: [id number] DOB: 1985/12/30    ADMISSION DATE:  Feb 22, 2017 CONSULTATION DATE:  02-22-17 DATE OF DEATH: 02-25-2017  REFERRING MD:  Dr. Manus Gunning    CHIEF COMPLAINT:  Cardiac Arrest - Asystol initial rhythm   FINAL CAUSE OF DEATH: Anoxic encephalopathy  SECONDARY CAUSES OF DEATH: Acute Respiratory arrest and respiratory failure Ventilator dependence Cardiac arrest PEA arrest Suspected aspiration pneumonia / aspiration pneumonitis ARDS NSTEMI IV drug abuse, narcotic addiction Acute renal failure, ATN Acute lactic metabolic acidosis Hyperkalemia Non-anion gap acidosis Hyponatremia Protein calorie malnutrition Anemia of chronic disease Thrombocytopenia Hyperglycemia     BRIEF SUMMARY / Hospital course:  [name] admitted 02-23-2023 after being found down in a hotel room with suspected heroin overdose.  Initial rhythm asystole > CPR, epi, narcan x 15 minutes with rhythm change to PEA > ROSC after approximately 15 minutes ACLS. Therapeutic temperature management initiated on admit. Course complicated by bilateral pulmonary infiltrate consistent with aspiration pneumonitis, probable PNA and ARDS. Also by acute renal failure with associated acidosis and hyperkalemia. Unfortunately after hypothermia lifted and sedation stopped the patient was unresponsive, comatose. Only movement was myoclonus, consistent with a severe brain injury. Neurology eval agreed that he had a severe anoxic brain injury. Given the poor prognosis for a meaningful recovery the decision was made with family to transition to comfort care. This was done Feb 25, 2017 and the patient died on the same day.   Levy Pupa, MD, PhD 03/07/2017, 1:27 PM [facility] [phone] or if no answer [phone]

## 2017-03-15 NOTE — Procedures (Signed)
Extubation Procedure Note  Patient Details:   Name: Hector Dennis DOB: 09/21/1985 MRN: [id number]   Airway Documentation:     Evaluation  O2 sats: stable throughout Complications: No apparent complications Patient did tolerate procedure well. Bilateral Breath Sounds: Rhonchi, Diminished   No   Patient terminally extubated per family wishes and MD order.   Marylin Lathon Lajuana RippleM Evangelos Paulino 02/19/2017, 5:48 PM

## 2017-03-15 NOTE — Progress Notes (Signed)
Repeat EEG completed at bedside off sedation. Results pending.

## 2017-03-15 NOTE — Procedures (Signed)
ELECTROENCEPHALOGRAM REPORT  Date of Study: 04-17-2017  Patient's Name: Hector Dennis MRN: [id number] Date of Birth:1985-11-11  Referring Provider: Dr. Koren BoundWesam Yacoub  Clinical History: This is a 31 year old man with suspected overdose, s/p cardiac arrest, with myoclonus.   Medications: Ampicillin-Sulbactam (UNASYN) 3 g in sodium chloride 0.9 % 100 mL IVPB  docusate (COLACE) 50 MG/5ML liquid 100 mg  famotidine (PEPCID) IVPB 20 mg premix  heparin injection 5,000 Units  hydrALAZINE (APRESOLINE) injection 10-20 mg  insulin aspart (novoLOG) injection 2-6 Units  insulin glargine (LANTUS) injection 10 Units  norepinephrine (LEVOPHED) 4 mg in dextrose 5 % 250 mL (0.016 mg/mL) infusion   Technical Summary: A multichannel digital EEG recording measured by the international 10-20 system with electrodes applied with paste and impedances below 5000 ohms performed as portable with EKG monitoring in an intubated and unresponsive patient.  Hyperventilation and photic stimulation were not performed.  The digital EEG was referentially recorded, reformatted, and digitally filtered in a variety of bipolar and referential montages for optimal display.   Description: The patient is intubated and unresponsive during the recording. Propofol turned off 4.5 hours prior to EEG. There is no clear posterior dominant rhythm. The background consists of diffuse suppression with generalized periodic sharp waves maximal over the bilateral frontopolar regions, with triphasic appearance, occurring every 1-2 seconds without evolution in frequency or amplitude. Some of the sharp waves are associated with head jerks seen on video. There no spontaneous variability or reactivity to stimulation noted. Normal sleep architecture is not seen.   EKG lead showed sinus tachycardia.  Impression: This EEG is abnormal due to the presence of diffuse suppression with generalized periodic discharges maximal over the bilateral frontopolar  regions. Some of the sharp waves are associated clinically with head jerk.   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    Patrcia DollyKaren Aquino, M.D.

## 2017-03-15 DEATH — deceased

## 2019-04-01 IMAGING — CT CT HEAD W/O CM
4 of 8 series · 14 of 47 positions shown, 16 images · non-contrast
Comparison: None.

CLINICAL DATA: [name]; 31-year-old with respiratory arrest.
Possible overdose.

EXAM:
CT HEAD WITHOUT CONTRAST
CT CERVICAL SPINE WITHOUT CONTRAST
TECHNIQUE: Multidetector CT imaging of the head and cervical spine was
performed following the standard protocol without intravenous
contrast. Multiplanar CT image reconstructions of the cervical spine
were also generated.

[Series 5: head bone · axial · 0.47mm/px · z∈[-78,-54]mm · 2 of 82 slices shown]
[im 12/82  bone]
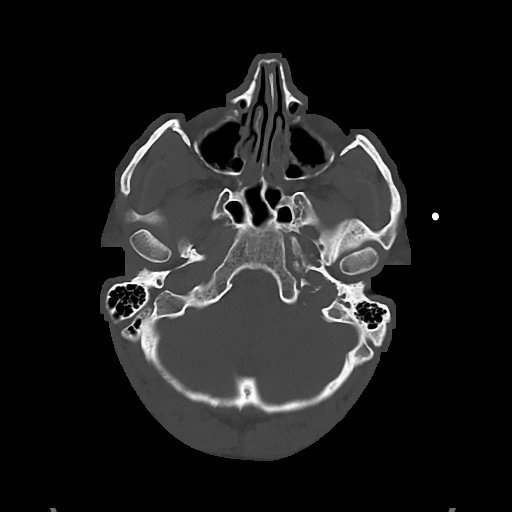
[im 24/82  bone]
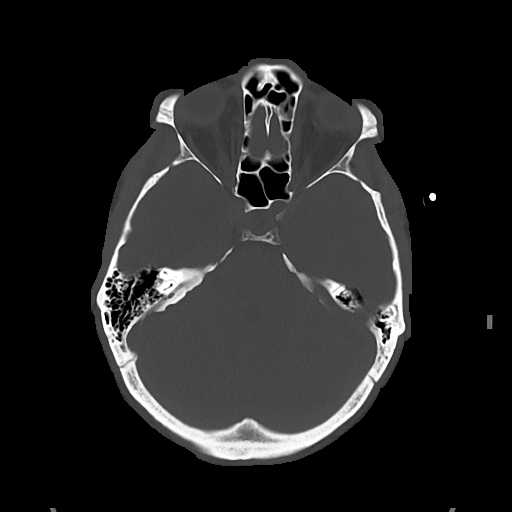

[Series 6: cor soft · coronal · 0.32mm/px · 3 of 80 slices shown]
[im 23/80  brain]
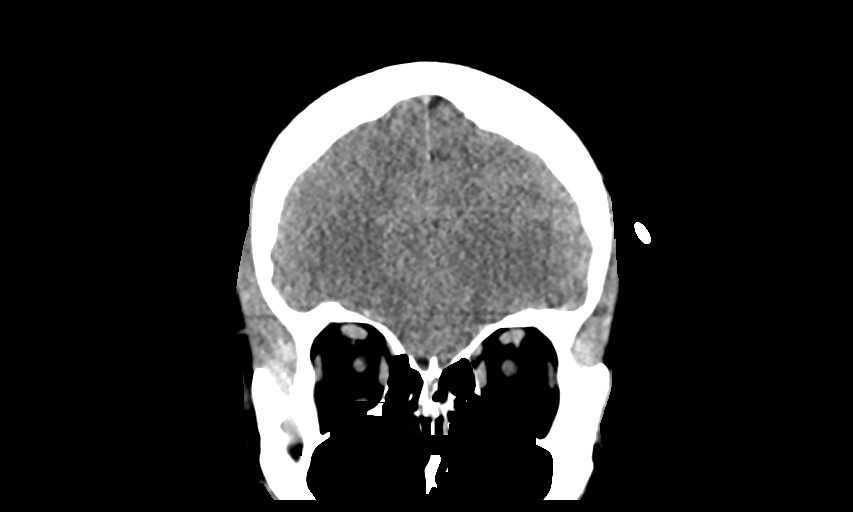
[im 34/80  brain]
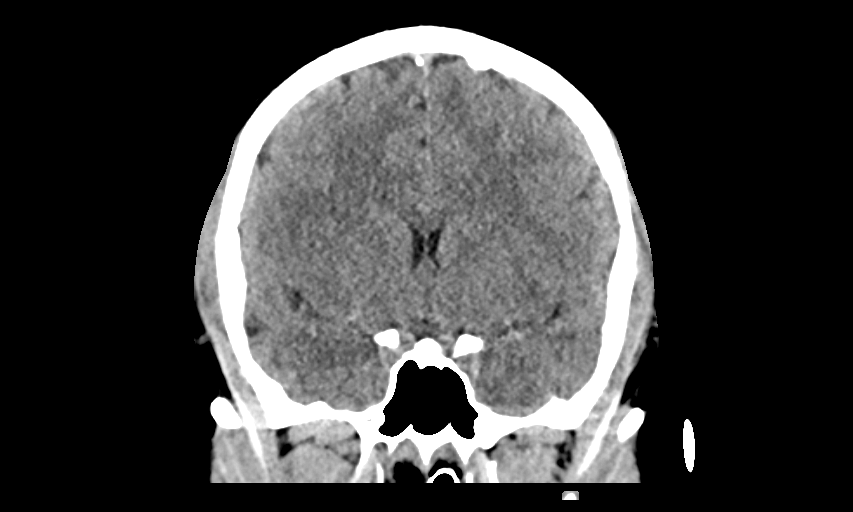
[im 46/80  brain]
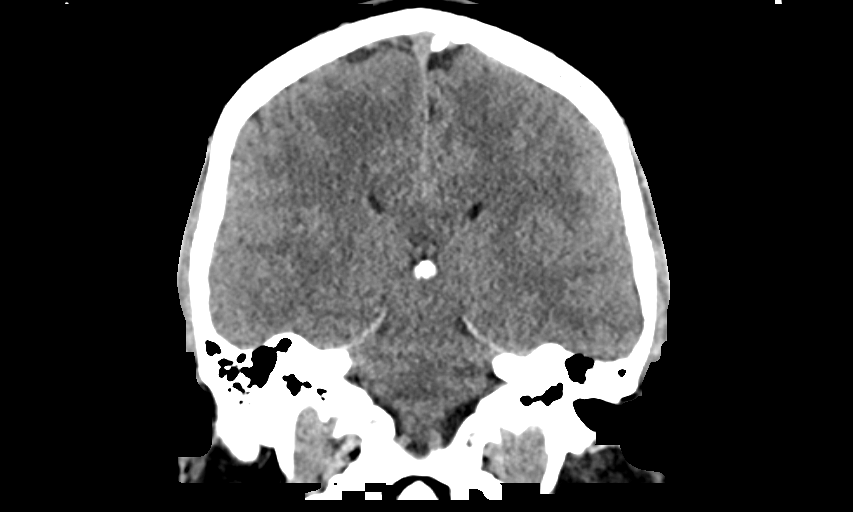

[Series 7: sag soft · sagittal · 0.36mm/px · 2 of 66 slices shown]
[im 22/66  brain]
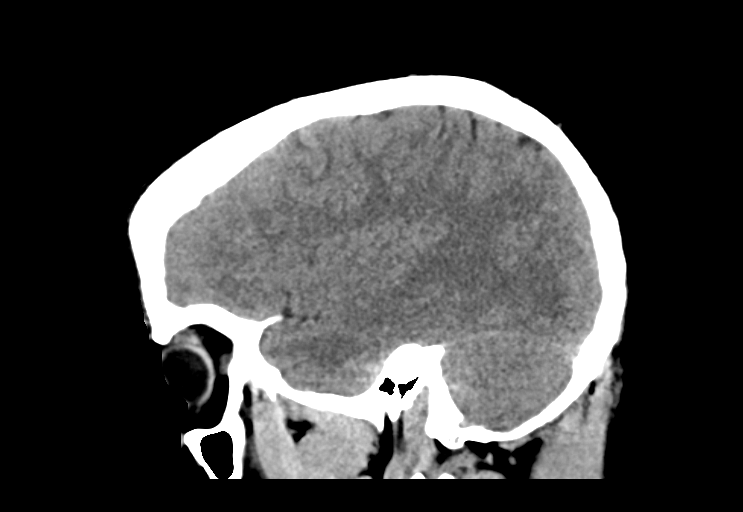
[im 44/66  brain]
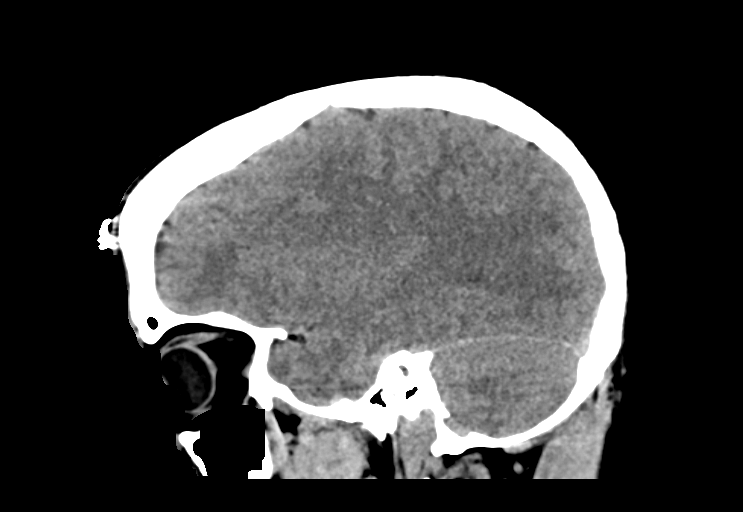

[Series 12: orthogonal axials · axial · 0.21mm/px · z∈[-242,-118]mm · 7 of 89 slices shown, 9 images]
[im 12/89  brain]
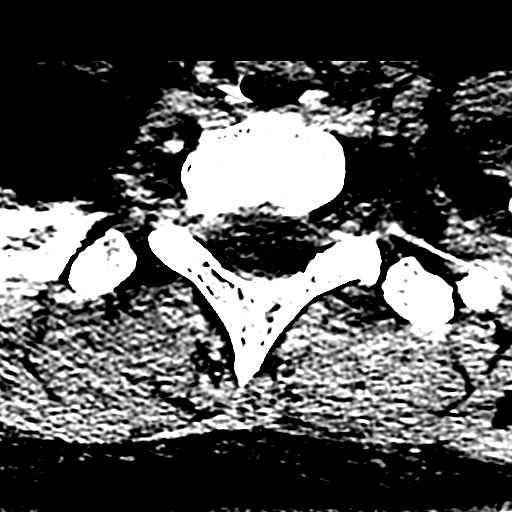
[im 12/89  bone]
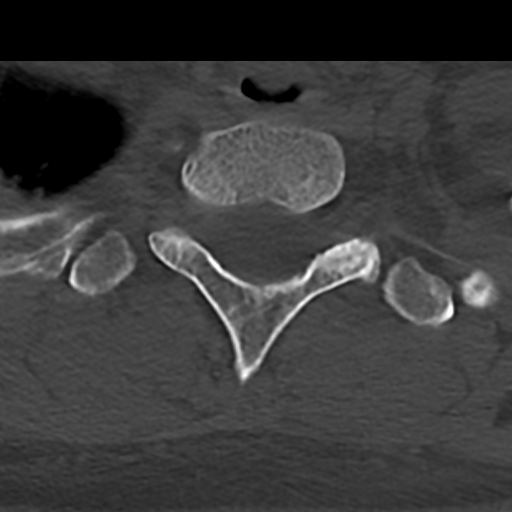
[im 23/89  brain]
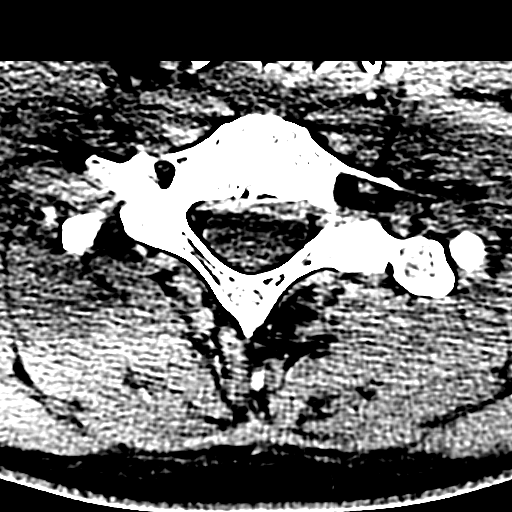
[im 34/89  brain]
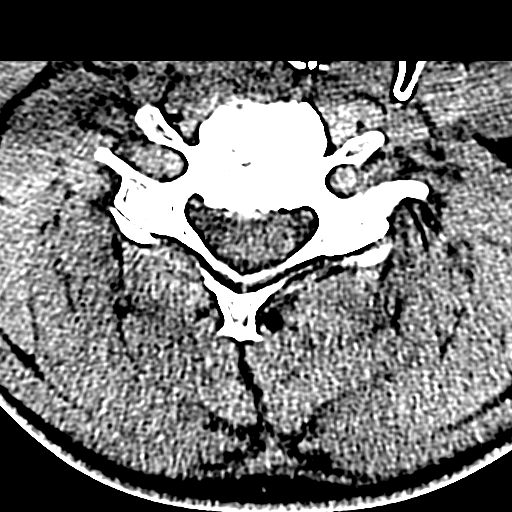
[im 45/89  brain]
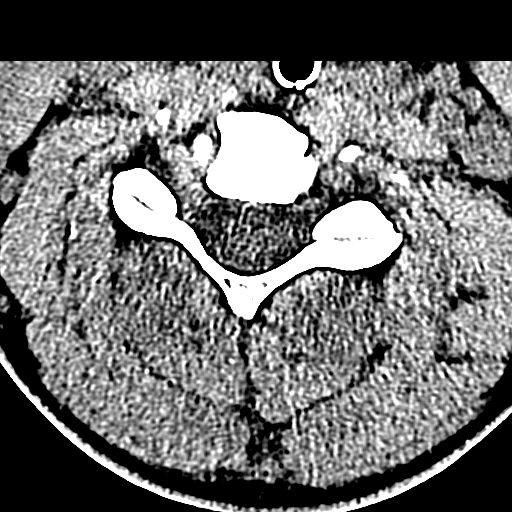
[im 56/89  brain]
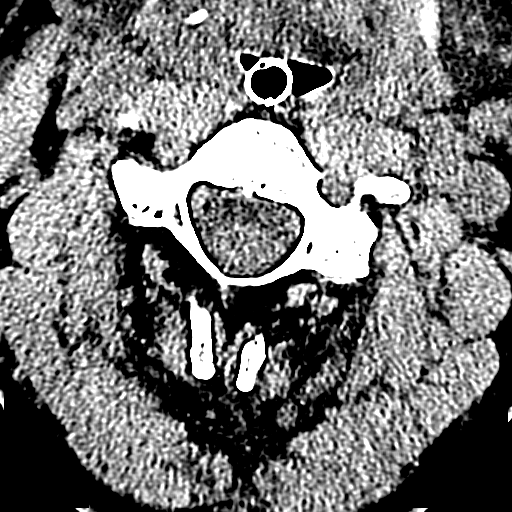
[im 56/89  bone]
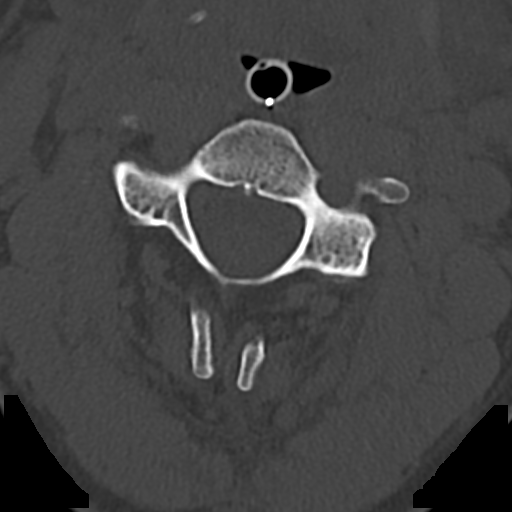
[im 67/89  brain]
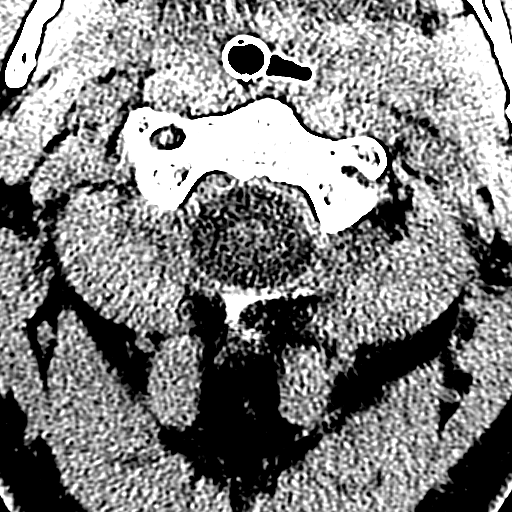
[im 78/89  brain]
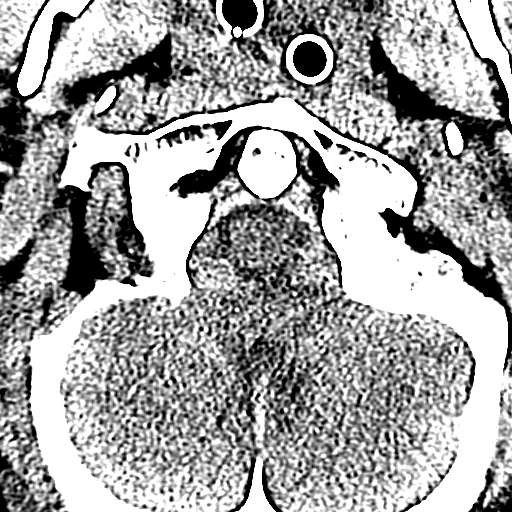

[14 of 47 positions shown; findings below may reference images not displayed]

FINDINGS: CT HEAD FINDINGS

Brain: No evidence of acute infarction, hemorrhage, hydrocephalus,
extra-axial collection or mass lesion/mass effect.

Vascular: No hyperdense vessel or unexpected calcification.

Skull: Normal. Negative for fracture or focal lesion.

Sinuses/Orbits: Mucosal thickening and left-greater-than-right
maxillary sinuses, left maxillary sinus mucous retention cyst, and
mucosal thickening of left anterior ethmoid sinuses. Normally
aerated mastoid air cells. Orbits are unremarkable.

Other: None.

CT CERVICAL SPINE FINDINGS

Alignment: Straightening of cervical lordosis with mild reversal
from the C5 through C7 levels. No listhesis.

Skull base and vertebrae: No acute fracture. No primary bone lesion
or focal pathologic process.

Soft tissues and spinal canal: No prevertebral fluid or swelling. No
visible canal hematoma.

Disc levels: Mild discogenic degenerative changes at the C5 through
C7 levels with disc space narrowing and anterior marginal
osteophytes. No high-grade bony canal stenosis.

Upper chest: Partially visualize left lung apex consolidation.

Other: Negative.
IMPRESSION: CT head:

1. No acute intracranial abnormality identified.
2. Mild paranasal sinus disease greatest in left maxillary sinus.
CT cervical spine:

1. No acute fracture or dislocation of the cervical spine.
2. Mild cervical degenerative changes at the C5-C7 levels.
3. Partially visualize consolidation in left lung apex may represent
pulmonary edema or pneumonia.

By: Hosse Passe M.D.

## 2019-04-02 IMAGING — DX DG CHEST 1V PORT
1 series · 1 of 1 positions shown · non-contrast
Comparison: 02/09/2017 chest radiograph

CLINICAL DATA: 31 y/o  M; central line placement.

EXAM:
PORTABLE CHEST 1 VIEW

[chest ap]
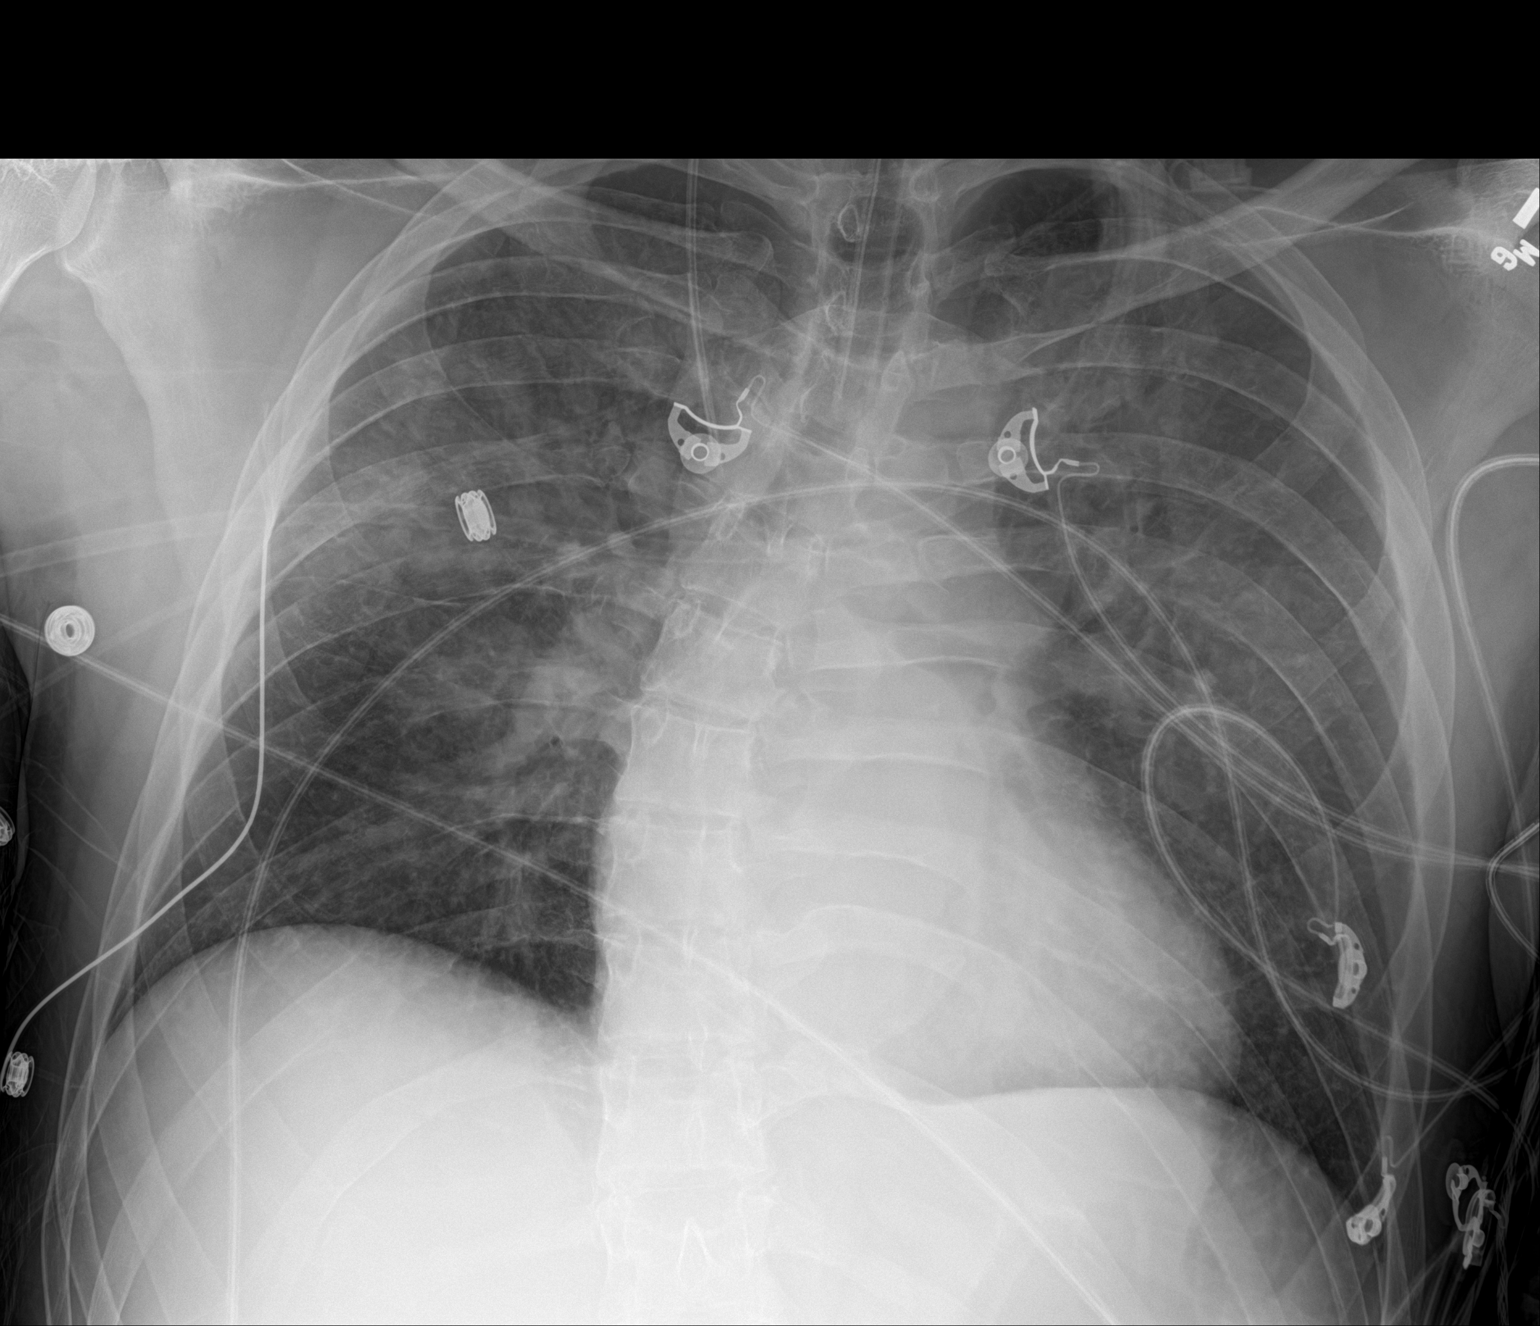

[1 of 1 positions shown; findings below may reference images not displayed]

FINDINGS: Endotracheal tube is 3.8 cm from the carina. Right central venous
catheter tip projects over the upper SVC. All stable bilateral mid
and upper lung zone opacities. No appreciable pneumothorax or
pleural effusion. Dextrocurvature of the thoracic spine. Stable
cardiac silhouette.
IMPRESSION: Right central venous catheter tip projects over the upper SVC.
Stable endotracheal tube. Stable mid and upper lung zone opacities.

By: Pavel-Iulea Cumpana M.D.

## 2019-04-03 IMAGING — DX DG CHEST 1V PORT
1 series · 2 of 2 positions shown · non-contrast
Comparison: February 10, 2017

CLINICAL DATA: Hypoxia

EXAM:
PORTABLE CHEST 1 VIEW

[Series 1: chest · 0.14mm/px · 2 of 2 slices shown]
[im 1/2]
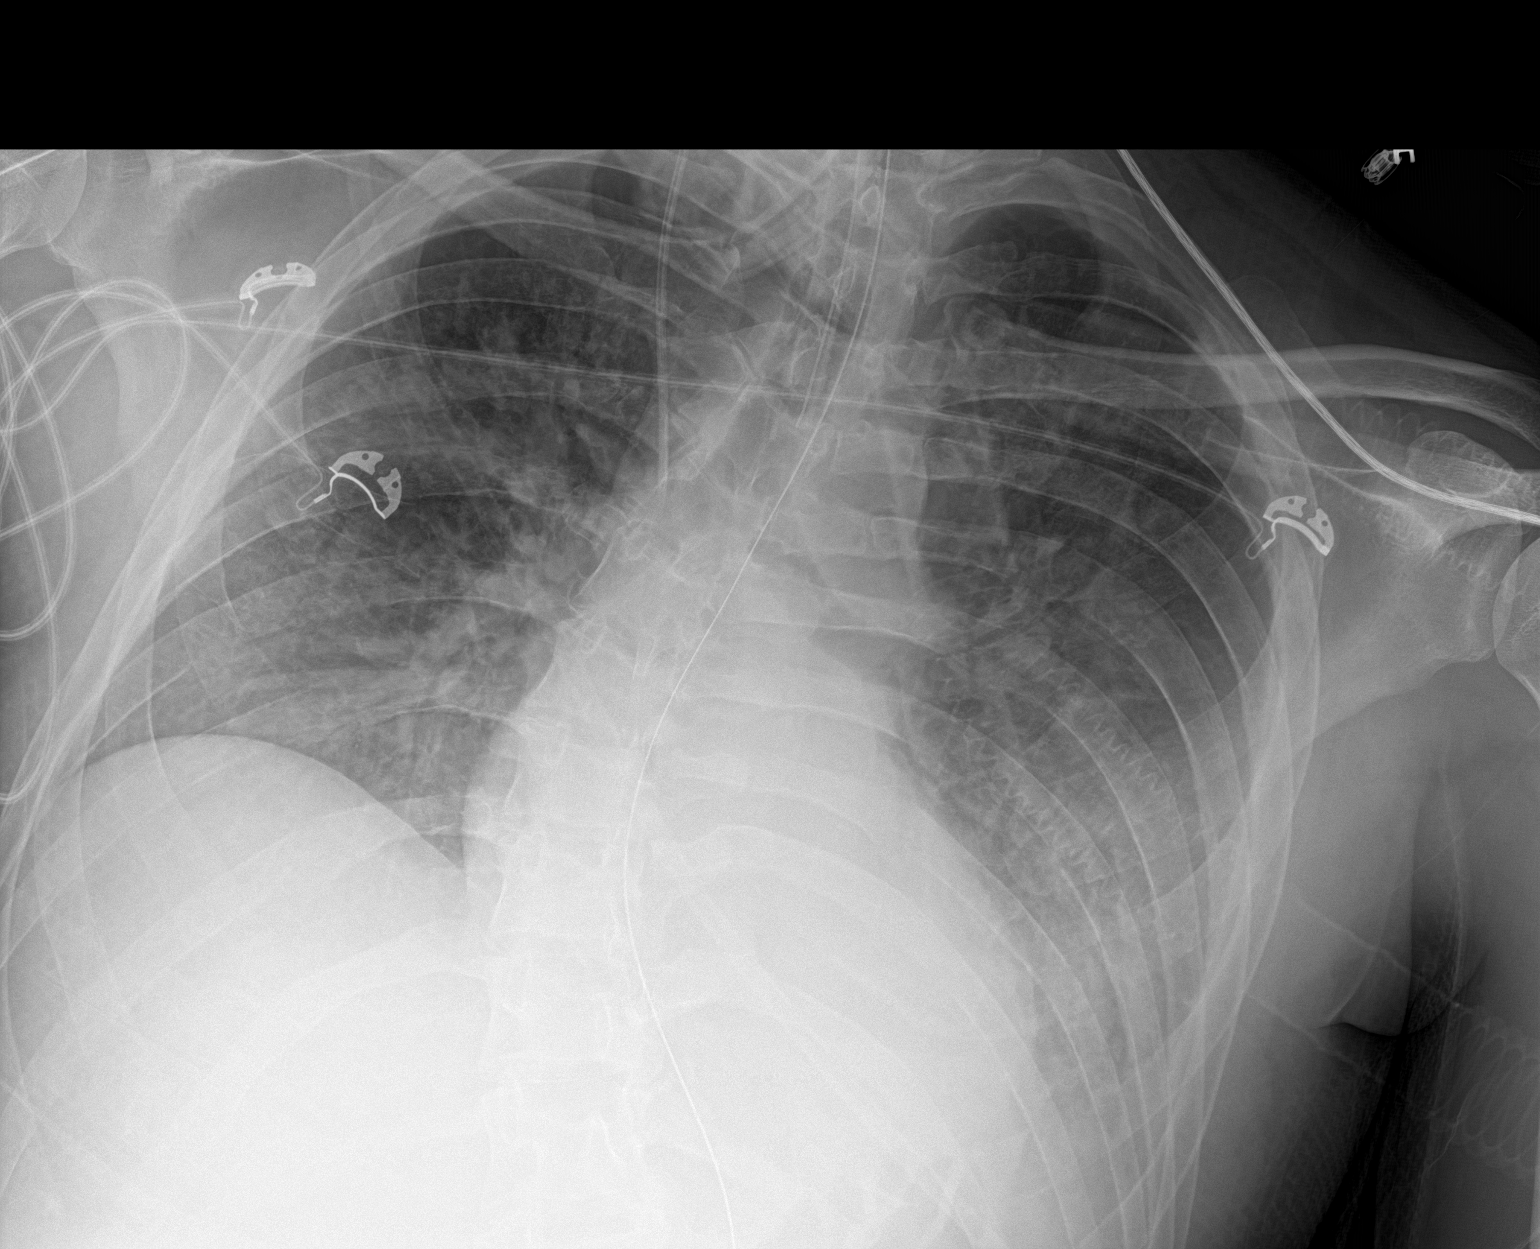
[im 2/2]
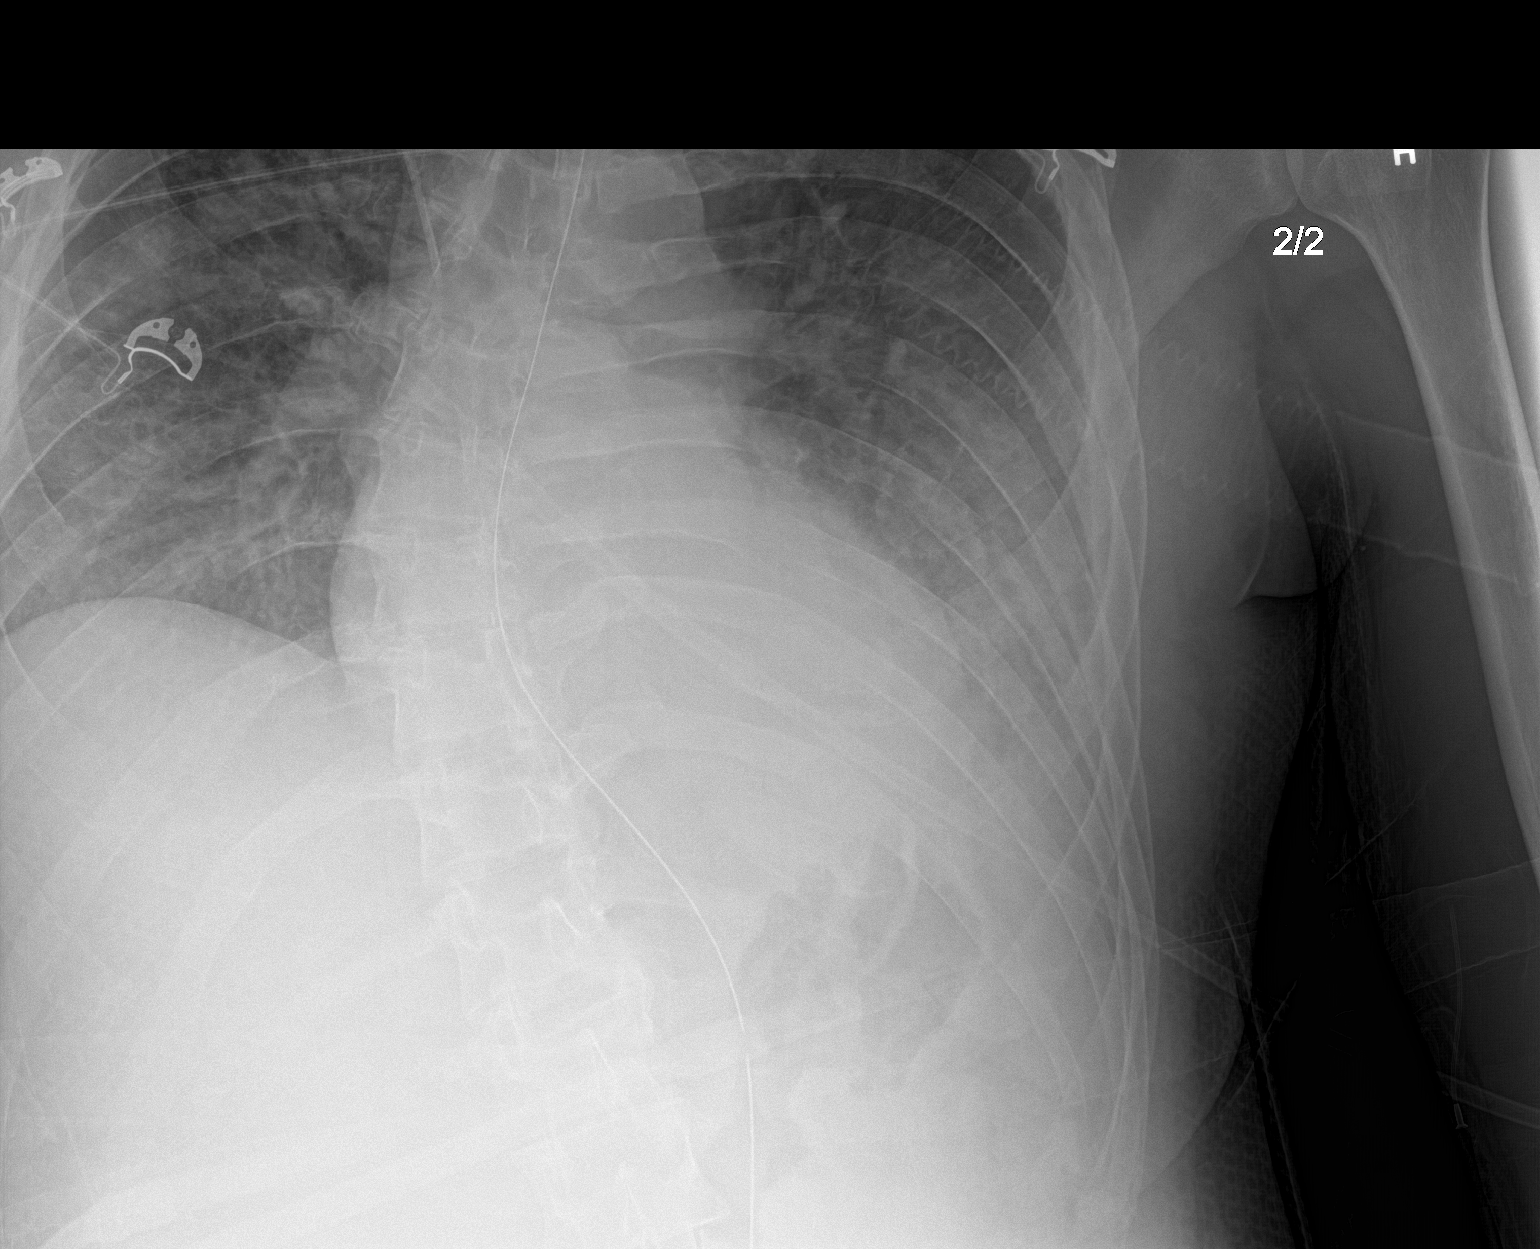

[2 of 2 positions shown; findings below may reference images not displayed]

FINDINGS: Endotracheal tube tip is 2.9 cm above the carina. Central catheter
tip is in the superior vena cava. Nasogastric tube bulla tip and
side port are in the stomach. No pneumothorax. There is interstitial
and patchy alveolar edema with left pleural effusion. Heart is
mildly enlarged with pulmonary venous hypertension. No adenopathy.
No bone lesions.
IMPRESSION: Tube and catheter positions as described without pneumothorax. There
is currently evidence congestive heart failure. Opacity in the left
lower lobe is probably due to alveolar edema, although a degree of
superimposed pneumonia in the left base cannot be excluded
radiographically.
